# Patient Record
Sex: Female | Born: 1960 | Race: White | Hispanic: No | Smoking: Former smoker
Health system: Southern US, Community
[De-identification: ages and names within clinical notes are randomized; demographics above are authoritative.]

## PROBLEM LIST (undated history)

## (undated) DIAGNOSIS — D649 Anemia, unspecified: Secondary | ICD-10-CM

## (undated) DIAGNOSIS — C801 Malignant (primary) neoplasm, unspecified: Secondary | ICD-10-CM

## (undated) DIAGNOSIS — K219 Gastro-esophageal reflux disease without esophagitis: Secondary | ICD-10-CM

## (undated) DIAGNOSIS — S73005A Unspecified dislocation of left hip, initial encounter: Secondary | ICD-10-CM

## (undated) HISTORY — PX: SKIN CANCER EXCISION: SHX779

## (undated) HISTORY — PX: ABDOMINAL HYSTERECTOMY: SHX81

## (undated) HISTORY — PX: TUBAL LIGATION: SHX77

## (undated) HISTORY — PX: CLOSED REDUCTION HIP DISLOCATION: SUR221

## (undated) HISTORY — PX: EYE SURGERY: SHX253

## (undated) HISTORY — PX: COSMETIC SURGERY: SHX468

## (undated) HISTORY — PX: TONSILLECTOMY: SUR1361

## (undated) HISTORY — PX: OPEN ANTERIOR SHOULDER RECONSTRUCTION: SHX2100

## (undated) HISTORY — PX: BILATERAL CARPAL TUNNEL RELEASE: SHX6508

## (undated) HISTORY — PX: NASAL SEPTUM SURGERY: SHX37

---

## 1990-09-05 DIAGNOSIS — K449 Diaphragmatic hernia without obstruction or gangrene: Secondary | ICD-10-CM | POA: Insufficient documentation

## 1990-09-05 DIAGNOSIS — K589 Irritable bowel syndrome without diarrhea: Secondary | ICD-10-CM | POA: Insufficient documentation

## 1998-12-21 ENCOUNTER — Ambulatory Visit (HOSPITAL_COMMUNITY): Admission: RE | Admit: 1998-12-21 | Discharge: 1998-12-21 | Payer: Self-pay | Admitting: Gynecology

## 1998-12-21 ENCOUNTER — Encounter: Payer: Self-pay | Admitting: Gynecology

## 2000-10-25 ENCOUNTER — Encounter (INDEPENDENT_AMBULATORY_CARE_PROVIDER_SITE_OTHER): Payer: Self-pay | Admitting: *Deleted

## 2000-10-25 ENCOUNTER — Other Ambulatory Visit: Admission: RE | Admit: 2000-10-25 | Discharge: 2000-10-25 | Payer: Self-pay | Admitting: Gastroenterology

## 2000-10-25 ENCOUNTER — Encounter: Payer: Self-pay | Admitting: Gastroenterology

## 2001-08-21 ENCOUNTER — Encounter (INDEPENDENT_AMBULATORY_CARE_PROVIDER_SITE_OTHER): Payer: Self-pay

## 2001-08-21 ENCOUNTER — Ambulatory Visit (HOSPITAL_COMMUNITY): Admission: RE | Admit: 2001-08-21 | Discharge: 2001-08-21 | Payer: Self-pay | Admitting: General Surgery

## 2002-01-09 ENCOUNTER — Encounter: Payer: Self-pay | Admitting: Family Medicine

## 2002-01-09 ENCOUNTER — Ambulatory Visit (HOSPITAL_COMMUNITY): Admission: RE | Admit: 2002-01-09 | Discharge: 2002-01-09 | Payer: Self-pay | Admitting: Family Medicine

## 2002-01-16 ENCOUNTER — Encounter: Admission: RE | Admit: 2002-01-16 | Discharge: 2002-01-16 | Payer: Self-pay | Admitting: Family Medicine

## 2002-01-16 ENCOUNTER — Encounter: Payer: Self-pay | Admitting: Family Medicine

## 2003-05-15 ENCOUNTER — Encounter (INDEPENDENT_AMBULATORY_CARE_PROVIDER_SITE_OTHER): Payer: Self-pay | Admitting: Gastroenterology

## 2004-02-02 ENCOUNTER — Encounter: Payer: Self-pay | Admitting: Internal Medicine

## 2004-11-22 ENCOUNTER — Ambulatory Visit (HOSPITAL_COMMUNITY): Admission: RE | Admit: 2004-11-22 | Discharge: 2004-11-22 | Payer: Self-pay | Admitting: Family Medicine

## 2005-03-10 ENCOUNTER — Encounter: Admission: RE | Admit: 2005-03-10 | Discharge: 2005-03-10 | Payer: Self-pay | Admitting: Family Medicine

## 2007-05-23 ENCOUNTER — Encounter: Admission: RE | Admit: 2007-05-23 | Discharge: 2007-05-23 | Payer: Self-pay | Admitting: General Surgery

## 2007-06-14 ENCOUNTER — Ambulatory Visit: Payer: Self-pay | Admitting: Internal Medicine

## 2007-06-14 LAB — CONVERTED CEMR LAB: Tissue Transglutaminase Ab, IgA: 0.2 units (ref <7)

## 2007-06-18 ENCOUNTER — Ambulatory Visit (HOSPITAL_COMMUNITY): Admission: RE | Admit: 2007-06-18 | Discharge: 2007-06-18 | Payer: Self-pay | Admitting: Internal Medicine

## 2007-07-03 ENCOUNTER — Encounter: Payer: Self-pay | Admitting: Internal Medicine

## 2007-07-03 ENCOUNTER — Encounter (INDEPENDENT_AMBULATORY_CARE_PROVIDER_SITE_OTHER): Payer: Self-pay | Admitting: *Deleted

## 2007-07-03 ENCOUNTER — Ambulatory Visit: Payer: Self-pay | Admitting: Internal Medicine

## 2007-08-07 ENCOUNTER — Ambulatory Visit: Payer: Self-pay | Admitting: Internal Medicine

## 2007-08-10 ENCOUNTER — Ambulatory Visit (HOSPITAL_COMMUNITY): Admission: RE | Admit: 2007-08-10 | Discharge: 2007-08-10 | Payer: Self-pay | Admitting: Internal Medicine

## 2008-03-05 ENCOUNTER — Telehealth: Payer: Self-pay | Admitting: Internal Medicine

## 2008-03-05 DIAGNOSIS — R1011 Right upper quadrant pain: Secondary | ICD-10-CM

## 2008-03-06 ENCOUNTER — Ambulatory Visit: Payer: Self-pay | Admitting: Internal Medicine

## 2008-03-06 LAB — CONVERTED CEMR LAB
Albumin: 3.6 g/dL (ref 3.5–5.2)
BUN: 16 mg/dL (ref 6–23)
Bilirubin, Direct: 0.1 mg/dL (ref 0.0–0.3)
CO2: 28 meq/L (ref 19–32)
Eosinophils Relative: 1.9 % (ref 0.0–5.0)
Hemoglobin, Urine: NEGATIVE
Leukocytes, UA: NEGATIVE
Lipase: 28 units/L (ref 11.0–59.0)
Lymphocytes Relative: 23 % (ref 12.0–46.0)
MCHC: 33 g/dL (ref 30.0–36.0)
Monocytes Relative: 5.5 % (ref 3.0–12.0)
Neutro Abs: 4.6 10*3/uL (ref 1.4–7.7)
Neutrophils Relative %: 67.9 % (ref 43.0–77.0)
Platelets: 398 10*3/uL (ref 150–400)
Potassium: 4.3 meq/L (ref 3.5–5.1)
RDW: 14.2 % (ref 11.5–14.6)
Total Bilirubin: 0.6 mg/dL (ref 0.3–1.2)
Total Protein, Urine: NEGATIVE mg/dL
WBC: 6.8 10*3/uL (ref 4.5–10.5)

## 2008-03-17 ENCOUNTER — Ambulatory Visit: Payer: Self-pay | Admitting: Internal Medicine

## 2008-03-17 DIAGNOSIS — K219 Gastro-esophageal reflux disease without esophagitis: Secondary | ICD-10-CM

## 2008-07-25 ENCOUNTER — Telehealth: Payer: Self-pay | Admitting: Internal Medicine

## 2008-11-26 ENCOUNTER — Encounter: Admission: RE | Admit: 2008-11-26 | Discharge: 2008-11-26 | Payer: Self-pay | Admitting: Family Medicine

## 2009-01-07 ENCOUNTER — Inpatient Hospital Stay (HOSPITAL_COMMUNITY): Admission: EM | Admit: 2009-01-07 | Discharge: 2009-01-11 | Payer: Self-pay | Admitting: Emergency Medicine

## 2009-01-08 ENCOUNTER — Encounter: Payer: Self-pay | Admitting: Nurse Practitioner

## 2009-01-11 ENCOUNTER — Encounter (INDEPENDENT_AMBULATORY_CARE_PROVIDER_SITE_OTHER): Payer: Self-pay | Admitting: *Deleted

## 2009-01-13 ENCOUNTER — Telehealth: Payer: Self-pay | Admitting: Internal Medicine

## 2009-01-14 ENCOUNTER — Ambulatory Visit: Payer: Self-pay | Admitting: Gastroenterology

## 2009-01-14 ENCOUNTER — Ambulatory Visit (HOSPITAL_COMMUNITY): Admission: RE | Admit: 2009-01-14 | Discharge: 2009-01-14 | Payer: Self-pay | Admitting: Gastroenterology

## 2009-01-14 ENCOUNTER — Inpatient Hospital Stay (HOSPITAL_COMMUNITY): Admission: AD | Admit: 2009-01-14 | Discharge: 2009-01-20 | Payer: Self-pay | Admitting: Gastroenterology

## 2009-01-14 DIAGNOSIS — R112 Nausea with vomiting, unspecified: Secondary | ICD-10-CM

## 2009-01-14 DIAGNOSIS — E876 Hypokalemia: Secondary | ICD-10-CM

## 2009-01-14 DIAGNOSIS — K56609 Unspecified intestinal obstruction, unspecified as to partial versus complete obstruction: Secondary | ICD-10-CM | POA: Insufficient documentation

## 2009-01-14 DIAGNOSIS — Z8719 Personal history of other diseases of the digestive system: Secondary | ICD-10-CM

## 2009-01-14 LAB — CONVERTED CEMR LAB
Calcium: 9.6 mg/dL (ref 8.4–10.5)
Chloride: 99 meq/L (ref 96–112)
Creatinine, Ser: 0.9 mg/dL (ref 0.4–1.2)

## 2009-01-15 ENCOUNTER — Encounter: Payer: Self-pay | Admitting: Nurse Practitioner

## 2009-02-13 ENCOUNTER — Encounter: Payer: Self-pay | Admitting: Internal Medicine

## 2009-02-24 ENCOUNTER — Ambulatory Visit (HOSPITAL_COMMUNITY): Admission: RE | Admit: 2009-02-24 | Discharge: 2009-02-24 | Payer: Self-pay | Admitting: General Surgery

## 2009-02-24 ENCOUNTER — Encounter (INDEPENDENT_AMBULATORY_CARE_PROVIDER_SITE_OTHER): Payer: Self-pay | Admitting: General Surgery

## 2009-02-24 ENCOUNTER — Encounter: Payer: Self-pay | Admitting: Internal Medicine

## 2009-03-20 ENCOUNTER — Encounter: Payer: Self-pay | Admitting: Internal Medicine

## 2011-01-18 LAB — HEMOGLOBIN AND HEMATOCRIT, BLOOD
HCT: 38 % (ref 36.0–46.0)
Hemoglobin: 13 g/dL (ref 12.0–15.0)

## 2011-01-19 LAB — COMPREHENSIVE METABOLIC PANEL
ALT: 26 U/L (ref 0–35)
ALT: 55 U/L — ABNORMAL HIGH (ref 0–35)
AST: 37 U/L (ref 0–37)
AST: 57 U/L — ABNORMAL HIGH (ref 0–37)
Albumin: 3.1 g/dL — ABNORMAL LOW (ref 3.5–5.2)
Albumin: 3.5 g/dL (ref 3.5–5.2)
Albumin: 3.9 g/dL (ref 3.5–5.2)
Alkaline Phosphatase: 59 U/L (ref 39–117)
Alkaline Phosphatase: 65 U/L (ref 39–117)
BUN: 6 mg/dL (ref 6–23)
Calcium: 9.6 mg/dL (ref 8.4–10.5)
Creatinine, Ser: 0.92 mg/dL (ref 0.4–1.2)
GFR calc Af Amer: >60 mL/min (ref >60)
GFR calc Af Amer: >60 mL/min (ref >60)
GFR calc non Af Amer: >60 mL/min (ref >60)
Potassium: 3.5 mEq/L (ref 3.5–5.1)
Potassium: 4.5 mEq/L (ref 3.5–5.1)
Sodium: 137 mEq/L (ref 135–145)
Sodium: 139 mEq/L (ref 135–145)
Total Protein: 5.7 g/dL — ABNORMAL LOW (ref 6.0–8.3)
Total Protein: 6.6 g/dL (ref 6.0–8.3)

## 2011-01-19 LAB — CBC
HCT: 44.3 % (ref 36.0–46.0)
Hemoglobin: 12 g/dL (ref 12.0–15.0)
Hemoglobin: 14.3 g/dL (ref 12.0–15.0)
MCHC: 33.3 g/dL (ref 30.0–36.0)
MCHC: 33.4 g/dL (ref 30.0–36.0)
MCHC: 33.5 g/dL (ref 30.0–36.0)
MCV: 88 fL (ref 78.0–100.0)
MCV: 88.3 fL (ref 78.0–100.0)
MCV: 88.8 fL (ref 78.0–100.0)
Platelets: 326 10*3/uL (ref 150–400)
Platelets: 329 10*3/uL (ref 150–400)
Platelets: 357 10*3/uL (ref 150–400)
Platelets: 364 10*3/uL (ref 150–400)
RBC: 4.61 MIL/uL (ref 3.87–5.11)
RDW: 14.1 % (ref 11.5–15.5)
RDW: 14.6 % (ref 11.5–15.5)
RDW: 14.7 % (ref 11.5–15.5)
WBC: 6.3 10*3/uL (ref 4.0–10.5)
WBC: 6.7 10*3/uL (ref 4.0–10.5)

## 2011-01-19 LAB — DIFFERENTIAL
Basophils Absolute: 0 10*3/uL (ref 0.0–0.1)
Basophils Relative: 0 % (ref 0–1)
Eosinophils Absolute: 0 10*3/uL (ref 0.0–0.7)
Eosinophils Relative: 0 % (ref 0–5)
Eosinophils Relative: 1 % (ref 0–5)
Lymphocytes Relative: 13 % (ref 12–46)
Lymphs Abs: 1.3 10*3/uL (ref 0.7–4.0)
Monocytes Absolute: 1.1 10*3/uL — ABNORMAL HIGH (ref 0.1–1.0)
Monocytes Relative: 16 % — ABNORMAL HIGH (ref 3–12)
Neutro Abs: 7.9 10*3/uL — ABNORMAL HIGH (ref 1.7–7.7)

## 2011-01-19 LAB — BASIC METABOLIC PANEL
BUN: 19 mg/dL (ref 6–23)
BUN: 21 mg/dL (ref 6–23)
CO2: 30 mEq/L (ref 19–32)
CO2: 31 mEq/L (ref 19–32)
Chloride: 104 mEq/L (ref 96–112)
Chloride: 108 mEq/L (ref 96–112)
Chloride: 97 mEq/L (ref 96–112)
Creatinine, Ser: 0.82 mg/dL (ref 0.4–1.2)
Creatinine, Ser: 0.94 mg/dL (ref 0.4–1.2)
Creatinine, Ser: 0.99 mg/dL (ref 0.4–1.2)
GFR calc Af Amer: >60 mL/min (ref >60)
GFR calc Af Amer: >60 mL/min (ref >60)
Potassium: 3.3 mEq/L — ABNORMAL LOW (ref 3.5–5.1)

## 2011-01-19 LAB — RENAL FUNCTION PANEL
CO2: 29 mEq/L (ref 19–32)
Calcium: 8.4 mg/dL (ref 8.4–10.5)
Chloride: 107 mEq/L (ref 96–112)
GFR calc Af Amer: >60 mL/min (ref >60)
GFR calc non Af Amer: >60 mL/min (ref >60)
Glucose, Bld: 97 mg/dL (ref 70–99)
Potassium: 3.6 mEq/L (ref 3.5–5.1)
Sodium: 140 mEq/L (ref 135–145)

## 2011-01-19 LAB — MAGNESIUM
Magnesium: 2.7 mg/dL — ABNORMAL HIGH (ref 1.5–2.5)
Magnesium: 2.8 mg/dL — ABNORMAL HIGH (ref 1.5–2.5)

## 2011-01-20 LAB — CBC
HCT: 50.8 % — ABNORMAL HIGH (ref 36.0–46.0)
Hemoglobin: 17.1 g/dL — ABNORMAL HIGH (ref 12.0–15.0)
MCV: 87.4 fL (ref 78.0–100.0)
Platelets: 397 10*3/uL (ref 150–400)
RBC: 5.81 MIL/uL — ABNORMAL HIGH (ref 3.87–5.11)
WBC: 11.9 10*3/uL — ABNORMAL HIGH (ref 4.0–10.5)

## 2011-01-20 LAB — URINALYSIS, ROUTINE W REFLEX MICROSCOPIC
Hgb urine dipstick: NEGATIVE
Nitrite: NEGATIVE
Protein, ur: 30 mg/dL — AB
Specific Gravity, Urine: 1.023 (ref 1.005–1.030)
Urobilinogen, UA: 1 mg/dL (ref 0.0–1.0)

## 2011-01-20 LAB — COMPREHENSIVE METABOLIC PANEL
Albumin: 4.3 g/dL (ref 3.5–5.2)
Alkaline Phosphatase: 91 U/L (ref 39–117)
BUN: 48 mg/dL — ABNORMAL HIGH (ref 6–23)
CO2: 34 mEq/L — ABNORMAL HIGH (ref 19–32)
Calcium: 9.7 mg/dL (ref 8.4–10.5)
Chloride: 86 mEq/L — ABNORMAL LOW (ref 96–112)
GFR calc Af Amer: 31 mL/min — ABNORMAL LOW (ref >60)
GFR calc non Af Amer: 25 mL/min — ABNORMAL LOW (ref >60)
Glucose, Bld: 169 mg/dL — ABNORMAL HIGH (ref 70–99)
Total Bilirubin: 1.6 mg/dL — ABNORMAL HIGH (ref 0.3–1.2)

## 2011-01-20 LAB — DIFFERENTIAL
Basophils Absolute: 0 10*3/uL (ref 0.0–0.1)
Basophils Relative: 0 % (ref 0–1)
Lymphocytes Relative: 9 % — ABNORMAL LOW (ref 12–46)
Monocytes Absolute: 1.3 10*3/uL — ABNORMAL HIGH (ref 0.1–1.0)
Neutro Abs: 9.6 10*3/uL — ABNORMAL HIGH (ref 1.7–7.7)

## 2011-01-20 LAB — LIPASE, BLOOD: Lipase: 24 U/L (ref 11–59)

## 2011-01-20 LAB — URINE MICROSCOPIC-ADD ON

## 2011-02-22 NOTE — Op Note (Signed)
NAME:  Elizabeth Leonard, Elizabeth Leonard NO.:  [id number]   MEDICAL RECORD NO.:  [id number]          PATIENT TYPE:  INP   LOCATION:  1340                         FACILITY:  [facility]   PHYSICIAN:  Sharlet Salina T. Hoxworth, M.D.DATE OF BIRTH:  [birth year]   DATE OF PROCEDURE:  01/15/2009  DATE OF DISCHARGE:                               OPERATIVE REPORT   PREOPERATIVE DIAGNOSES:  Small bowel obstruction secondary to adhesions.   POSTOPERATIVE DIAGNOSES:  Small-bowel obstruction secondary to  adhesions.   SURGICAL PROCEDURE:  Laparoscopy and lysis of adhesions for small bowel  obstruction.   SURGEON:  Dr. Johna Sheriff   ANESTHESIA:  General.   BRIEF HISTORY:  Elizabeth Leonard is a 50 year old female whose only previous  surgical history is hysterectomy through a Pfannenstiel incision, who  presents with persistent evidence of small bowel obstruction clinically  and by plain x-ray.  I have recommended proceeding with laparoscopy and  possible laparotomy for her small-bowel obstruction.  The nature of  procedure, its indications, risks of anesthetic complications, bleeding,  infection and intestinal injury were discussed and understood.  She is  now brought to the operating room for this procedure.   DESCRIPTION OF OPERATION:  The patient was brought to the operating room  and placed in the supine position on the operating table and general  endotracheal anesthesia was induced.  PAS were in place.  She received  preoperative IV antibiotics.  The abdomen was widely sterilely prepped  and draped.  The correct patient and procedure were verified.  Access  was obtained with a 1-cm incision just below the umbilicus and  dissection carried down to the midline fascia which was incised for 1-  cm.  The peritoneum entered under direct vision.  Through a mattress  suture of 0 Vicryl, the Hassan trocar was placed and pneumoperitoneum  established.  Laparoscopy showed decompressed distal loops of small  bowel  and markedly dilated proximal loops of small bowel, but there did  not appear to be any adhesions down into her pelvis or to the anterior  abdominal wall from the previous surgery.  I placed two more 5-mm  trocars in the right lower quadrant and began to run the small bowel  retrograde.  The cecum and appendix appeared normal.  The terminal ileum  was completely decompressed.  The pelvis was free of adhesions from her  previous surgery.  The small bowel was traced proximally and at the  proximal ileum a point of obstruction was encountered where the bowel  was going down underneath an appendix epiploica from the sigmoid colon  which was tightly tethered down to the retroperitoneum and the small  bowel mesentery.  The bowel was very dilated proximal to this and seemed  to be sliding slightly underneath this bridge of tissue.  I then placed  another 5-mm trocar in the right lower quadrant and exposing this area,  I divided the appendix epiploica up near the colon and released this and  the small bowel popped up into good view.  However, because of the  inflammation of the appendix epiploica there was still  adhesions between  two loops of small bowel and the appendix epiploica was forming a band  over one of them.  I suspect this was a torsed appendix epiploica that  had become inflamed and had stuck down over the small intestine.  The  tissue which appeared to be inflamed, partially necrotic fat was then  divided over the narrowed loop of bowel which then completely released  the obstruction and clearly was the point.  I dissected that tissue off  the wall of the small bowel with careful sharp dissection completely and  the bowel was clearly widely opened and uninjured.  There no further  adhesions proximally with dilated loops of bowel up toward the ligament  of Treitz.  Following this, the abdomen was thoroughly irrigated with  saline and hemostasis assured.  Trocars removed  and all CO2  evacuated.  The mattress suture was secured to the umbilical  incision.  The skin incisions were closed with subcuticular 4-0 Vicryl  and Dermabond.  Sponge, needle and instrument counts correct.  The  patient was taken to recovery in good condition.      Lorne Skeens. Hoxworth, M.D.  Electronically Signed     BTH/MEDQ  D:  01/15/2009  T:  01/15/2009  Job:  [id number]

## 2011-02-22 NOTE — H&P (Signed)
NAME:  MARTRICE, APT NO.:  [id number]   MEDICAL RECORD NO.:  [id number]          PATIENT TYPE:  INP   LOCATION:  1340                         FACILITY:  [facility]   PHYSICIAN:  Hedwig Morton. Juanda Chance, MD     DATE OF BIRTH:  06-19-1961   DATE OF ADMISSION:  01/14/2009  DATE OF DISCHARGE:                              HISTORY & PHYSICAL   HISTORY OF PRESENT ILLNESS:  Ms. 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  Ms. Murin presented to the ER January 07, 2009 with nausea,  vomiting, dehydration, acute renal failure.  A CT scan the abdomen and  pelvis with contrast revealed a distal small-bowel obstruction without  evident etiology suggesting adhesions.  There was cholelithiasis and a  small amount of ascites among the leaves of the mesentery.  The  patient's small-bowel obstruction resolved with conservative measures  including n.p.o. status and NG tube.  She was seen by surgery during  that admission.  By Adonis Brook 4, 2010, the patient was tolerating clear  liquids and was discharged home.  Her acute renal failure had resolved.  This was the patient's first episode of a partial small-bowel  obstruction.  She does have a remote history of a hysterectomy.  Since  leaving the hospital January 11, 2009, the patient has had episodes of  nausea and vomiting almost daily.  She is usually fine in the morning  but, after eating breakfast and lunch, feels like things build up and  she finally vomits.  The patient has a history of GERD and complains of  persistent belching and acid regurgitation despite Nexium.  Emesis is  not feculent as it was in the hospital.  No fevers.  No significant  abdominal pain.   PAST MEDICAL HISTORY:  GERD, irritable bowel, hiatal hernia,  questionable Barrett's esophagus, no evidence of Barrett's on the last  few EGDs.  Cholelithiasis.   PAST SURGICAL HISTORY:  Tonsillectomy and  adenoids, hysterectomy, tubal  ligation, bilateral carpal tunnel release, repair of a deviated septum.   FAMILY HISTORY:  Family history of breast cancer.  No family history of  colon cancer.  Family medical history of diabetes and heart disease.   SOCIAL HISTORY:  The patient works in Audiological scientist.  She is a former  smoker.  Occasionally consumes alcohol.   REVIEW OF SYSTEMS:  All review of systems reviewed and negative other  than what is noted in the HPI.   CURRENT MEDICATIONS:  1. Darvocet N-100 one every 4 to 6 hours as needed.  2. Nexium 40 mg daily.  3. Multiple vitamins.   ALLERGIES:  DEMEROL.   PHYSICAL ASSESSMENT:  Weight 229, blood pressure 104/72, heart rate 72.  Ms. Flater is a pleasant 50 year old female in no acute distress.  HEENT:  No scleral icterus.  Conjunctivae pink.  NECK:  Supple.  No palpable lymphadenopathy.  CARDIAC:  Regular rate and rhythm.  No appreciable murmurs.  RESPIRATORY:  Bilateral lung fields clear.  GI:  Abdomen soft, nontender, nondistended.  No obvious masses or  hepatomegaly.  No obvious hernias.  Bowel movements are normal.  EXTREMITIES:  No palmar erythema.  No edema.  MUSCULOSKELETAL:  Symmetrical with no gross deformities.  Normal  posture.  NEUROLOGIC:  Alert and oriented.  Grossly normal neurologically.  SKIN:  Intact without significant rashes or lesions.  PSYCH:  Alert and cooperative.  Normal mood and affect.   IMPRESSION:  1. Small-bowel obstruction.  Flat and upright of the abdomen obtained      after this office visit and it does show a recurrent or early      partial small-bowel obstruction.  2. Irritable bowel syndrome.  3. Hypokalemia.  4. Cholelithiasis.   PLAN:  The patient will be admitted to [facility] for further  evaluation and treatment.  Probable surgical reconsult.  See orders for  additional plan of treatment.      Willette Cluster, NP      Hedwig Morton. Juanda Chance, MD  Electronically Signed     PG/MEDQ  D:  01/15/2009  T:  01/15/2009  Job:  [id number]

## 2011-02-22 NOTE — H&P (Signed)
NAME:  Elizabeth Leonard, Elizabeth Leonard NO.:  [id number]   MEDICAL RECORD NO.:  [id number]          PATIENT TYPE:  INP   LOCATION:  1305                         [facility]ACILITY[facility]   PHYSICIAN:  Altha Harm, MDDATE OF BIRTH:  1961/06/19   DATE OF ADMISSION:  01/07/2009  DATE OF DISCHARGE:                              HISTORY & PHYSICAL   CHIEF COMPLAINT:  Passed out.   HISTORY OF PRESENT ILLNESS:  This is a 50 year old female who reported  that she has been having nausea and vomiting for the past 3 days.  She  states that she has been unable to keep anything down and her emesis has  been productive of bilious nature.  But there had been no blood noted.  She states that early this morning she started having dizziness upon  standing up, went to the refrigerator and passed out.  She denies any  palpitations, any scotoma, any chest pain, any cough.  She states that  prior to passing out, she did have dizziness which she has been having  for about a day and she was also having nausea.  She was seen in the ER  and found to have a blood pressure of 97/56 upon arrival.  The patient  does not remember how long she was out for.  She states that her  daughter was present and called EMS.  The patient has no prior cardiac  history and she denies any preceding focal deficits.  She states that  she has had no measurable fever but has been having chills.  She states  that her urine output has been decreased over the past 3 days as her  oral intake has been markedly decreased.  The patient also describes  abdominal pain which she describes as periumbilical, dull and rated as 7  to 10 out of 10.  She states the pain is consistent for the past 3 days.  It is relieved with emesis and the pain is nonradiating in its nature.  She has had a sick contact.  Her daughter has had a viral illness which  has caused her to have nausea, vomiting and diarrhea.  The patient  however denies any diarrhea.   PAST MEDICAL HISTORY:  1. Significant for gastroesophageal reflux disease.  2. Irritable bowel syndrome.  3. Allergic rhinitis.   FAMILY HISTORY:  The patient's family history was explored and denies  any chronic illnesses in any first degree relatives in her family.   SOCIAL HISTORY:  The patient works as an Airline pilot.  She denies any  tobacco, alcohol or drug use.  She lives at home with her husband and  her daughter.   CURRENT MEDICATIONS:  Include Nexium and doxycycline.  She does not know  the doses.   ALLERGIES:  No known drug allergies.   GASTROENTEROLOGY:  She sees Dr. Marina Goodell.   REVIEW OF SYSTEMS:  14 systems were reviewed, all systems are negative  except as noted in the HPI.   STUDIES IN THE EMERGENCY ROOM:  She had  laboratory studies which showed  a hemogram with  white blood  cell count of 11.9, hemoglobin of 17.1,  hematocrit of 50.8,  platelet count of 397.  Sodium 128, potassium 3.2,  chloride 80, bicarb 30, BUN 48, creatinine 2.08.  No radiologic studies  were done in the emergency room.   PHYSICAL EXAMINATION:  This is a well-nourished, well-developed female  appearing in mild distress secondary to abdominal pain.  VITAL SIGNS:  Temperature 98.6, heart rate 78, blood pressure 118/60,  respirations 18, saturations are 98% on room air.  HEENT:  She is normocephalic, atraumatic.  Pupils are equally round and  reactive to light and accommodation.  She is anicteric with no  conjunctival pallor.  Oropharynx is moist.  No exudate, erythema or  lesions are noted.  NECK:  Trachea is midline.  No masses, no thyromegaly, no JVD, no  carotid bruit.  RESPIRATORY:  She has a normal respiratory effort, equal excursion  bilaterally.  No wheezing or rhonchi noted.  CARDIOVASCULAR:  She has a  normal S1-S2.  No murmurs, rubs or gallops are noted.  PMI is  nondisplaced.  No heaves or thrills on palpation.  ABDOMEN:  Soft, obese, diffuse tenderness, more marked tenderness in  the  left lower quadrant than anywhere else in the abdomen.  No masses, no  hepatosplenomegaly noted.  LYMPHATICS:  On survey she has no cervical, axillary, inguinal  lymphadenopathy noted.  NEUROLOGIC:  No focal neurological deficits.  Cranial nerves II through  XII grossly intact.  CARDIOVASCULAR:  She has a normal S1-S2, no murmurs, rubs or gallops  noted.  PMI is nondisplaced.  No heaves or thrills on palpation.  MUSCULOSKELETAL:  No CVA tenderness.  No warmth, swelling or erythema  around the joints.  PSYCHIATRIC:  She is alert and oriented x3.  Good insight and cognition.  Good recent and remote recall.   ASSESSMENT AND PLAN:  This is a patient who presents with:  1. Intractable nausea and vomiting and abdominal pain.  Etiology is      unknown at this time.  I will go ahead and get an acute abdominal      series on this patient given that she is having bilious emesis.      This could likely just be gastroenteritis but need to rule out the      advent of an ileus or any other intra-abdominal processes.  I will      hold off in doing a CT scan at this point on the patient until we      get the results of the acute abdominal series.  The patient's left      lower quadrant pain is somewhat bothersome but the patient does      have a history of irritable bowel syndrome and abdominal pain as      she reports it.  We will hydrate the patient and observe her      response to initial therapy.  2.  Acute renal failure.  This is      likely secondary to volume depletion.  The patient is having her      volume repleted  aggressively and she will have a repeat follow-up      on her renal function.  I expect that it will resolve as she      increases her volume intake.  2. Hypokalemia.  Potassium is being replaced.  3. The patient also received GI and DVT prophylaxis with Protonix and      unfractionated heparin.  The patient will receive symptomatic  support for her nausea, vomiting and  will start her on clear      liquids.   Total time for this admission:  One hour      Altha Harm, MD  Electronically Signed     MAM/MEDQ  D:  01/07/2009  T:  01/07/2009  Job:  [id number]
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
## 2011-02-22 NOTE — Op Note (Signed)
NAME:  Elizabeth Leonard, Elizabeth Leonard NO.:  [id number]   MEDICAL RECORD NO.:  [id number]          PATIENT TYPE:  AMB   LOCATION:  DAY                          [facility]AC[facility]   PHYSICIAN:  Sharlet Salina T. Hoxworth, M.D.DATE OF BIRTH:  08/14/1961   DATE OF PROCEDURE:  02/24/2009  DATE OF DISCHARGE:                               OPERATIVE REPORT   PREOPERATIVE DIAGNOSIS:  Cholelithiasis and cholecystitis.   POSTOPERATIVE DIAGNOSIS:  Cholelithiasis and cholecystitis.   SURGICAL PROCEDURE:  Laparoscopic cholecystectomy with intraoperative  cholangiogram.   SURGEON:  Sharlet Salina T. Hoxworth, MD.   ANESTHESIA:  General.   ASSISTANT:  Anselm Pancoast. Zachery Dakins, MD.   BRIEF HISTORY:  Elizabeth Leonard is a 50 year old female, who presents with  episodic epigastric and right upper quadrant abdominal pain and nausea  typical for biliary colic.  She is about 3 weeks following a  laparoscopic lysis of adhesions for a small bowel obstruction.  She was  known to have gallstones at that time but was asymptomatic.  She began  developing symptoms over the last couple of weeks.  She is doing well  otherwise at this point, and I have recommended proceeding with  laparoscopic cholecystectomy with cholangiogram.  The nature of the  procedures, indications, risks of bleeding, infection, bile leak, bile  duct injury, and anesthetic complications were discussed and understood.  She is now brought to the operating room for this procedure.   DESCRIPTION OF OPERATION:  The patient was brought to the operating  room, placed in the supine position on the operating table, and a  general endotracheal anesthesia was induced.  The abdomen was widely  sterilely prepped and draped.  She received preoperative IV antibiotics.  PAS were in place.  Correct patient and procedure were verified.  Access  was obtained with a 5 mm Optiview trocar in the right upper quadrant  without difficulty and pneumoperitoneum established.   There were no  adhesions to the previous 1 cm infraumbilical laparoscopic open port and  this was therefore sharply incised into the peritoneum, and through a  mattress suture of 0 Vicryl the Hassan trocar was placed.  Another 11-mm  trocar was placed subxiphoid and another 5-mm trocar more laterally in  the right flank.  The gallbladder was visualized and was not acutely  inflamed.  The fundus was grasped and elevated up over the liver and the  infundibulum retracted inferolaterally.  The peritoneum anterior and  posterior to Calot's triangle was incised and the distal gallbladder  thoroughly dissected.  The cystic duct was identified, dissected free,  and the cystic duct/gallbladder junction dissected 360 degrees.  When  the anatomy appeared clear, an operative cholangiogram was obtained  through the cystic duct, which showed good filling of normal common bile  duct and intrahepatic ducts with free flow into the duodenum and no  filling defects.  Following this, the cholangiocath was removed and the  cystic duct was triply clipped proximally and divided.  The cystic  artery was doubly clipped proximally, clipped distally, and divided.  The gallbladder was then dissected free from its bed using hook  cautery  and removed through the umbilicus.  The subhepatic space was carefully  irrigated and inspected and there was no evidence of bleeding.  Following this, trocars removed, all CO2 evacuated, and the mattress  suture was secured at  the umbilical incision.  This was then also reinforced with 2 simple  fascial 0 Vicryl sutures.  Skin was closed with subcuticular Monocryl  and Dermabond.  Sponge, needle, and instrument counts were correct.  The  patient was taken to recovery in good condition.      Lorne Skeens. Hoxworth, M.D.  Electronically Signed     BTH/MEDQ  D:  02/24/2009  T:  02/24/2009  Job:  [id number]

## 2011-02-22 NOTE — Assessment & Plan Note (Signed)
[facility]                         GASTROENTEROLOGY OFFICE NOTE   ANELY, SPIEWAK                       MRN:          [id number]  DATE:08/07/2007                            DOB:          1961/02/02    HISTORY:  Elizabeth Leonard presents today for followup.  She is a 50 year old  who was evaluated June 14, 2007 regarding multiple GI complaints.  See that dictation for details.  For reflux disease with intermittent  dysphagia she was placed on Nexium 40 mg daily.  For presumed irritable  bowel, she was treated with a combination of a probiotic and Bentyl.  A  tissue transglutaminase antibody was obtained and returned normal.  She  underwent colonoscopy and upper endoscopy.  Colonoscopy, including  intubation of the terminal ileum was normal.  Random biopsies of the  colon were normal with no evidence of microscopic colitis.  Upper  endoscopy revealed a small sliding hiatal hernia.  There was prior  question of Barrett's esophagus on remote endoscopy with subsequent  negative exams.  The Z line itself was somewhat irregular, though.  Obvious Barrett's was not clearly present.  Multiple biopsies were taken  with the aid of narrow-band imaging.  These returned normal with no  evidence of Barrett's.  She presents today for followup.  First, she  reports significant improvement of reflux symptoms.  She does have  occasional breakthrough.  Her dysphagia has resolved on medication  alone.  In terms of loose stools related to irritable bowel she feels  that the probiotic was quite helpful, and diarrhea is currently not a  problem.  She does have intermittent ongoing abdominal discomfort which  is cramping, though improved with Bentyl.  She does describe 1 episode  of severe sharp pain in the region of the left shoulder blade that  radiated into the left chest and subsequently into the abdomen.  She  feels certain it is gallbladder attack, and requests a  hepatobiliary  scan.  Prior abdominal ultrasound was negative with no evidence of  gallstones.   CURRENT MEDICATIONS:  1. Nexium 40 mg daily.  2. Bentyl 20 mg p.r.n.   PHYSICAL EXAMINATION:  A well-appearing female in no acute distress.  Blood pressure is 124/72, heart rate is 84, weight is 268 pounds.  HEENT:  Sclerae anicteric.  Her abdomen is soft without tenderness, masses, or hernia.  Good bowel  sounds heard.   IMPRESSION:  1. Gastroesophageal reflux disease.  Symptoms much better controlled      with Nexium.  2. Questionable history of Barrett's.  No evidence of such on her past      3 endoscopies.  I do not recommend further surveillance.  3. Irritable bowel syndrome with completely normal colonoscopy as      described.  4. Left scapular pain of uncertain etiology. Question musculoskeletal.      Rule out atypical biliary colic (patient concerned).   RECOMMENDATIONS:  1. Continue Nexium.  2. Continue reflux precautions with attention to weight loss.  3. Use the probiotic p.r.n. irritable bowel exacerbation.  4. Continue Bentyl p.r.n. for irritable bowel.  5. Schedule HIDA scan.  6. Secure primary care Siyah Mault as soon as possible for routine      medical care.Advised.  7. Routine Gastrointestinal followup in 1 year unless interval      questions or problems.     Wilhemina Bonito. Marina Goodell, MD  Electronically Signed    JNP/MedQ  DD: 08/07/2007  DT: 08/07/2007  Job #: [id number]   cc:   Sheppard Plumber. Earlene Plater, M.D.

## 2011-02-22 NOTE — Discharge Summary (Signed)
NAME:  Elizabeth Leonard, CHANCY NO.:  [id number]   MEDICAL RECORD NO.:  [id number]          PATIENT TYPE:  INP   LOCATION:  1305                         FACILITY:  [facility]   PHYSICIAN:  Renee Ramus, MD       DATE OF BIRTH:  1961/05/31   DATE OF ADMISSION:  01/07/2009  DATE OF DISCHARGE:  01/11/2009                               DISCHARGE SUMMARY   PRIMARY DISCHARGE DIAGNOSIS:  Small bowel obstruction.   SECONDARY DIAGNOSES:  1. Transient hypokalemia.  2. Acute renal failure, secondary to prerenal dehydration.  3. Transient hyponatremia.  4. Gastroesophageal reflux disease.  5. Irritable bowel syndrome.   HOSPITAL COURSE BY PROBLEM:  1. Small bowel obstruction.  The patient is a 50 year old female who      was admitted secondary to severe nausea, vomiting, and crampy      abdominal pain.  The patient had the symptoms approximately 4 days      prior to admission.  She had severe nausea and vomiting.  She came      in dehydrated, acute renal failure.  She was given IV fluids.  She      had NG tube placed.  She had a surgical consult.  The patient      benefited from watchful waiting.  Her symptoms are now resolved.      She has good bowel sounds and has tolerated clear-liquid diet very      well.  The patient will be discharged home with instructions to      follow up with her primary care physician if needed.  She has been      instructed to continue clear liquids x3 days, and to fully advance      her diet after that.  The patient has also been instructed not to      take dicyclomine until cleared by her primary care physician.  2. Gastroesophageal reflux disease.  The patient will be continued on      proton pump inhibitor.  3. Transient hypokalemia and hyponatremia.  This is now resolved with      electrolyte replacement.   LABORATORY DATA:  Labs of note,  1. Initial leukocytosis, white count 11.9, decreasing to 6.2 without      antibiotic therapy.  2. Initial  elevated hemoglobin and hematocrit of 17.1 and 50.8      respectively.  This is decreased to hemoglobin of 12 and hematocrit      of 36 after IV fluid.  3. Initial BUN of 48, creatinine of 2.08, this has decreased to BUN of      12 and creatinine 0.8 with IV fluid.  4. Magnesium somewhat elevated initially at 2.7.  5. Albumin is mildly low at 3.1.  6. UA showing specific gravity of 1.023 with no evidence of infection.   STUDIES:  1. Abdominal CT scan showing distal small bowel obstruction.  2. Followup abdominal films showing clearing of distal small bowel      obstruction with last film showing a normal distribution of gas and      stool and  a nondilated colon.   MEDICATIONS ON DISCHARGE:  Nexium 40 mg p.o. daily.   The patient was asked not to take dicyclomine.  There are no labs or  studies pending at the time of discharge.  The patient is in stable  condition and anxious for discharge.   TIME SPENT:  35 minutes.      Renee Ramus, MD  Electronically Signed     JF/MEDQ  D:  01/11/2009  T:  01/12/2009  Job:  [id number]   cc:   Lorne Skeens. Hoxworth, M.D.  [address]  [place]  [place] [address]

## 2011-02-22 NOTE — Consult Note (Signed)
NAME:  ALLIEN, MELBERG NO.:  [id number]   MEDICAL RECORD NO.:  [id number]          PATIENT TYPE:  INP   LOCATION:  1305                         FACILITY:  [facility]   PHYSICIAN:  Sharlet Salina T. Hoxworth, M.D.DATE OF BIRTH:  March 24, 1961   DATE OF CONSULTATION:  01/08/2009  DATE OF DISCHARGE:                                 CONSULTATION   CHIEF COMPLAINT:  Abdominal pain, nausea, vomiting.   HISTORY OF PRESENT ILLNESS:  I was asked by the [facility] team to  evaluate Ms. Kalb.  She is a pleasant 50 year old female who states she  was in her usual state of good health until 5 days ago, when she awoke  early in the morning with severe crampy left lower quadrant and left mid  abdominal pain.  This pain was intermittent, crampy in nature, and quite  severe at times.  Several hours after this, she began to develop nausea  and vomiting with initially light green bilious vomiting and then dark  green.  The pain was temporarily relieved by vomiting but then would  recur in a few hours.  She continued to put up with these symptoms at  home, thinking she might just have a virus, but became progressively  weaker, and then yesterday while getting up she had a syncopal episode  and presented to the [facility] emergency room for evaluation.  She has  continued to have the intermittent left-sided crampy abdominal pain and  vomiting even since admission.  She has not had a bowel movement or  flatus since the onset of her symptoms.  She states she had a normal  bowel movement the day before this illness.  She had not had any  abdominal complaints or GI symptoms leading up to this illness.  She has  felt a little bit chilled and had a temperature of 99.  She has had no  urinary burning, frequency.  No hematemesis or rectal bleeding.  She has  not noted any lumps or bulges on her abdominal wall.   PAST MEDICAL HISTORY:  Previous surgery significant for a hysterectomy  through a  Pfannenstiel incision.  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  Medically she is followed for GERD and irritable bowel syndrome  at [facility].   Medications on admission were Nexium and doxycycline for skin  irritation.   ALLERGIES:  NO KNOWN DRUG ALLERGIES.   SOCIAL HISTORY:  She works at a [employer].  Married.  Rare alcohol.  No  tobacco.   FAMILY HISTORY:  Noncontributory.   REVIEW OF SYSTEMS:  GENERAL:  Positive for some malaise and possible low-  grade fever.  HEENT:  Denies vision, hearing, swallowing problems.  RESPIRATORY:  No shortness of breath, cough, wheezing, history of lung  disease.  CARDIAC:  No chest pain, palpitations, history of heart  disease.  ABDOMEN/GASTROINTESTINAL:  As above.  She has intermittent IBS  but not active recently.  Reflux controlled with medications.  GENITOURINARY:  As above.  MUSCULOSKELETAL:  No joint swelling,  pain,  arthritis.  HEMATOLOGIC:  No history of blood clots, abnormal bleeding.   PHYSICAL EXAMINATION:  VITAL SIGNS:  She is afebrile.  Heart rate is 74,  respirations 20, blood pressure 133/78, O2 sats 96% on 2 liters.  GENERAL:  Pleasant, mildly overweight white female in no acute distress.  SKIN:  Warm and dry.  No rash or infection.  HEENT:  No palpable masses or thyromegaly.  Sclerae nonicteric.  Oropharynx clear.  LYMPH NODES:  No cervical, subclavicular or inguinal nodes palpable.  LUNGS:  Clear without wheezing or increased work of breathing.  CARDIAC:  Regular rate and rhythm.  No murmurs.  No edema.  ABDOMEN:  Well-healed Pfannenstiel incision.  No hernias.  Mild  distention.  Bowel sounds are present.  There is mild left lower  quadrant and left mid abdominal tenderness without guarding or rebound.  No palpable mass.  EXTREMITIES:  No joint swelling or deformity.  NEUROLOGIC:  Alert, oriented.  Motor and sensory grossly normal.   LABORATORY:   White count today is 6.7, down from 11.9 on admission.  Hemoglobin is 14.3.  Platelets normal.  Chemistries on admission showed  a BUN of 48, now down to 28.  Electrolytes normal.  She had hyponatremia  and hypokalemia on presentation.  Creatinine is down from 2.8 to 1.0  today.  Glucose 116.  LFTs normal.  Urinalysis negative except for mild  proteinuria, perhaps consistent with partial bowel obstruction although  cannot rule out severe constipation.   ASSESSMENT/PLAN:  A 50 year old white female with history of  hysterectomy and presentation abdominal x-rays consistent with bowel  obstruction, likely secondary to adhesions.  Cannot completely rule out  severe constipation on plain films.  She is scheduled for a CT scan,  which should help sort this out.  I agree with bowel rest with NG IV  fluids, and we will follow closely with you.      Lorne Skeens. Hoxworth, M.D.  Electronically Signed     BTH/MEDQ  D:  01/08/2009  T:  01/08/2009  Job:  [id number]

## 2011-02-25 NOTE — Discharge Summary (Signed)
NAME:  Elizabeth Leonard, Elizabeth Leonard NO.:  [id number]   MEDICAL RECORD NO.:  [id number]          PATIENT TYPE:  INP   LOCATION:  1340                         FACILITY[facility]  [facility]   PHYSICIAN:  Sharlet Salina T. Hoxworth, M.D.DATE OF BIRTH:  23-Jul-1961   DATE OF ADMISSION:  01/14/2009  DATE OF DISCHARGE:  01/20/2009                               DISCHARGE SUMMARY   DISCHARGE DIAGNOSIS:  Small-bowel obstruction.   OPERATIONS AND PROCEDURES:  Laparoscopy with relief of small-bowel  obstruction January 15, 2009.   HISTORY OF PRESENT ILLNESS:  Ms. Bookwalter is a 50 year old female who was  admitted January 07, 2009 with x-ray evidence of distal small bowel  obstruction which was managed nonoperatively.  She has no history of any  previous surgery.  She did well at discharge but now presents with 24  hours of recurrent nausea, vomiting, bilious emesis, had no bowel  movement x3 days.   PAST MEDICAL HISTORY:  Surgery significant only for hysterectomy done  through a Pfannenstiel incision.  Also, mild reflux.   MEDICATIONS:  Darvocet p.r.n., and Nexium, multivitamins.   ALLERGIES:  DEMEROL.   HOSPITAL COURSE:  The patient was admitted with flat and upright  abdominal x-ray showing small bowel distention.  This was small-bowel  obstruction.  She was initially treated with NG suction, IV fluids, pain  and nausea medication.  She symptomatically improved.  However,  following day on April 8 flat and upright abdomen showed no improvement  of small bowel obstruction pattern.  At this point I recommended  laparoscopic exploration with possibility of laparotomy.  She was taken  to the operating room and findings at laparoscopy were a __________  inflamed appendix __________ to the sigmoid colon which had become  adherent to the small bowel causing a high-grade obstruction.  She  underwent laparoscopic lysis of these inflammatory adhesions.  She  postoperatively did well.  NG tube was discontinued on the  first  postoperative day.  She rapidly regained bowel function.  Diet was  advanced and she is discharged home on January 20, 2009.  The wounds are  healing nicely.  Tolerating regular diet.   DISCHARGE MEDICATIONS:  Same as admission.   FOLLOWUP:  In my office in 1 week.      Lorne Skeens. Hoxworth, M.D.  Electronically Signed     BTH/MEDQ  D:  03/03/2009  T:  03/03/2009  Job:  [id number]

## 2011-02-25 NOTE — Op Note (Signed)
[facility]  Patient:    Elizabeth Leonard, Elizabeth Leonard Visit Number: [id number] MRN: [id number]          Service Type: DSU Location: DAY Attending Physician:  Carson Myrtle Dictated by:   Sheppard Plumber Earlene Plater, M.D. Proc. Date: 08/21/01 Admit Date:  08/21/2001   CC:         Hope M. Danella Deis, M.D.   Operative Report  PREOPERATIVE DIAGNOSIS:  Squamous cell carcinoma of lower leg.  POSTOPERATIVE DIAGNOSIS:  Squamous cell carcinoma of lower leg.  PROCEDURE:  Wide excision of squamous cell carcinoma of lower extremity.  SURGEON:  Timothy E. Earlene Plater, M.D.  ANESTHESIA:  Local standby.  INDICATIONS:  The patient is 30 and otherwise healthy.  Had several lesions removed by Dr. Campbell Stall and a lesion of the left lower leg was returned as squamous cell carcinoma, well-differentiated, margins positive.  She is here now for a wide excision.  The lesion is located in the middle of the left lower leg, slightly lateral to the anterior midline.  It measures 11 mm.  Her permission is granted.  DESCRIPTION OF PROCEDURE:  The patient was taken to the operating room and placed supine, and IV ______ sedation given.  The left lower extremity was prepped and draped in the usual fashion.  Marcaine 0.25% with epinephrine was used for local anesthesia.  Under magnification, the lesion was carefully measured, and then I was able to excise 3+ mm on either side, both cephalad and caudad, and then completed the ellipse so that closure could be accomplished lateral-to-lateral.  The ellipse was completed and the lesions completely excised with the underlying subcutaneous tissue.  Flaps were raised by dissecting the subcutaneous tissue from the fascia, and then retention sutures of 0 nylon were placed.  Subsequent retention sutures of 2-0 nylon were placed and then the skin incision was actually closed with a running 3-0 nylon.  She tolerated it well.  Specimen was placed in formalin and  sent to the lab. Written and verbal instructions given.  She will be seen and followed in the office. Dictated by:   Sheppard Plumber Earlene Plater, M.D. Attending Physician:  Carson Myrtle DD:  08/21/01 TD:  08/21/01 Job: [id number]

## 2017-07-17 ENCOUNTER — Encounter: Payer: Self-pay | Admitting: Internal Medicine

## 2020-01-16 ENCOUNTER — Ambulatory Visit: Payer: Self-pay | Attending: Internal Medicine

## 2020-01-16 DIAGNOSIS — Z23 Encounter for immunization: Secondary | ICD-10-CM

## 2020-02-10 ENCOUNTER — Ambulatory Visit: Payer: Self-pay | Attending: Internal Medicine

## 2020-02-10 DIAGNOSIS — Z23 Encounter for immunization: Secondary | ICD-10-CM

## 2020-02-10 NOTE — Progress Notes (Signed)
   Covid-19 Vaccination Clinic  Name:  Elizabeth Leonard    MRN: [id number] DOB: [birth year]  02/10/2020  Elizabeth Leonard was observed post Covid-19 immunization for 15 minutes without incident. She was provided with Vaccine Information Sheet and instruction to access the V-Safe system.   Elizabeth Leonard was instructed to call 911 with any severe reactions post vaccine: Marland Kitchen Difficulty breathing  . Swelling of face and throat  . A fast heartbeat  . A bad rash all over body  . Dizziness and weakness   Immunizations Administered    Name Date Dose VIS Date Route   Pfizer COVID-19 Vaccine 02/10/2020 10:55 AM 0.3 mL 12/04/2018 Intramuscular   Manufacturer: [place]   Lot: [id number]   [place]: [id number]      Covid-19 Vaccination Clinic  Name:  Elizabeth Leonard    MRN: [id number] DOB: 1961/08/16  02/10/2020  Elizabeth Leonard was observed post Covid-19 immunization for 15 minutes without incident. She was provided with Vaccine Information Sheet and instruction to access the V-Safe system.   Elizabeth Leonard was instructed to call 911 with any severe reactions post vaccine: Marland Kitchen Difficulty breathing  . Swelling of face and throat  . A fast heartbeat  . A bad rash all over body  . Dizziness and weakness   Immunizations Administered    Name Date Dose VIS Date Route   Pfizer COVID-19 Vaccine 02/10/2020 10:55 AM 0.3 mL 12/04/2018 Intramuscular   Manufacturer: [place]   Lot: [id number]   [place]: [id number]

## 2021-01-12 ENCOUNTER — Encounter: Payer: Self-pay | Admitting: Orthopedic Surgery

## 2021-01-12 ENCOUNTER — Ambulatory Visit (INDEPENDENT_AMBULATORY_CARE_PROVIDER_SITE_OTHER): Payer: Self-pay

## 2021-01-12 ENCOUNTER — Ambulatory Visit: Payer: Self-pay | Admitting: Orthopedic Surgery

## 2021-01-12 DIAGNOSIS — M25461 Effusion, right knee: Secondary | ICD-10-CM

## 2021-01-12 DIAGNOSIS — M25561 Pain in right knee: Secondary | ICD-10-CM

## 2021-01-12 MED ORDER — METHYLPREDNISOLONE ACETATE 40 MG/ML IJ SUSP
40.0000 mg | INTRAMUSCULAR | Status: AC | PRN
  Administered 2021-01-12: 40 mg via INTRA_ARTICULAR

## 2021-01-12 MED ORDER — LIDOCAINE HCL (PF) 1 % IJ SOLN
5.0000 mL | INTRAMUSCULAR | Status: AC | PRN
  Administered 2021-01-12: 5 mL

## 2021-01-12 NOTE — Progress Notes (Signed)
Office Visit Note   Patient: Elizabeth Leonard           Date of Birth: Oct 25, 1960           MRN: [id number] Visit Date: 01/12/2021              Requested by: No referring provider defined for this encounter. PCP: No primary care provider on file.  Chief Complaint  Patient presents with  . Right Knee - Pain    S/p fall down steps Saturday      HPI: Patient is a 60 year old woman who states that she twisted her knee on the stairs Saturday morning she heard a pop and states she almost passed out from the pain.  She states she did not fall.  She is currently wearing a neoprene knee sleeve.  She complains of pain and swelling she has been using ice and elevation.  Assessment & Plan: Visit Diagnoses:  1. Acute pain of right knee   2. Effusion, right knee     Plan: Will obtain a CT scan to further evaluate the right knee.  She will minimize her weightbearing use her walker and follow-up this week after the CT scan is obtained.  Follow-Up Instructions: Return in about 1 week (around 01/19/2021).   Ortho Exam  Patient is alert, oriented, no adenopathy, well-dressed, normal affect, normal respiratory effort. Examination patient has no ecchymosis or bruising around the right knee there is an effusion.  Varus and valgus stresses stable anterior drawer is stable.  Medial lateral joint lines are nontender to palpation.  She has active extension with no extensor lag.  The knee was aspirated for the superior lateral portal and 30 cc of a hemarthrosis was aspirated.  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  There is a suggestion of a possible nondisplaced lateral tibial plateau fracture.  No images are attached to the encounter.  Labs: No results found for: HGBA1C, ESRSEDRATE, CRP, LABURIC, REPTSTATUS, GRAMSTAIN, CULT, LABORGA   Lab Results  Component Value Date   ALBUMIN  2.8 (L) 01/16/2009   ALBUMIN 3.9 01/14/2009   ALBUMIN 3.1 (L) 01/11/2009    Lab Results  Component Value Date   MG 2.7 (H) 01/10/2009   MG 2.8 (H) 01/09/2009   MG 2.7 (H) 01/08/2009   No results found for: VD25OH  No results found for: PREALBUMIN CBC EXTENDED Latest Ref Rng & Units 02/24/2009 01/14/2009 01/11/2009  WBC 4.0 - 10.5 K/uL - 10.1 6.2  RBC 3.87 - 5.11 MIL/uL - 5.02 4.07  HGB 12.0 - 15.0 g/dL 13.0 14.8 12.0  HCT 36.0 - 46.0 % 38.0 44.3 36.0  PLT 150 - 400 K/uL - 357 326  NEUTROABS 1.7 - 7.7 K/uL - 7.9(H) -  LYMPHSABS 0.7 - 4.0 K/uL - 1.3 -     There is no height or weight on file to calculate BMI.  Orders:  Orders Placed This Encounter  Procedures  . XR Knee 1-2 Views Right   No orders of the defined types were placed in this encounter.    Procedures: Large Joint Inj: R knee on 01/12/2021 4:00 PM Indications: pain and diagnostic evaluation Details: 22 G 1.5 in needle, superolateral approach  Arthrogram: No  Medications: 5 mL lidocaine (PF) 1 %; 40 mg methylPREDNISolone acetate 40 MG/ML Aspirate: 30 mL bloody Outcome: tolerated well, no immediate complications Procedure, treatment alternatives, risks and benefits explained,  specific risks discussed. Consent was given by the patient. Immediately prior to procedure a time out was called to verify the correct patient, procedure, equipment, support staff and site/side marked as required. Patient was prepped and draped in the usual sterile fashion.      Clinical Data: No additional findings.  ROS:  All other systems negative, except as noted in the HPI. Review of Systems  Objective: Vital Signs: There were no vitals taken for this visit.  Specialty Comments:  No specialty comments available.  PMFS History: Patient Active Problem List   Diagnosis Date Noted  . HYPOKALEMIA 01/14/2009  . SMALL BOWEL OBSTRUCTION 01/14/2009  . NAUSEA AND VOMITING 01/14/2009  . SMALL BOWEL OBSTRUCTION, HX OF 01/14/2009  .  GERD 03/17/2008  . ABDOMINAL PAIN RIGHT UPPER QUADRANT 03/05/2008  . OBESITY, MORBID 12/06/2007  . HIATAL HERNIA 09/05/1990  . IRRITABLE BOWEL SYNDROME 09/05/1990   History reviewed. No pertinent past medical history.  History reviewed. No pertinent family history.  History reviewed. No pertinent surgical history. Social History   Occupational History  . Not on file  Tobacco Use  . Smoking status: Not on file  . Smokeless tobacco: Not on file  Substance and Sexual Activity  . Alcohol use: Not on file  . Drug use: Not on file  . Sexual activity: Not on file

## 2021-01-14 ENCOUNTER — Telehealth: Payer: Self-pay | Admitting: Orthopedic Surgery

## 2021-01-14 NOTE — Telephone Encounter (Signed)
Per [facility]: 01/14/21 pt applying for cone assistant,and will call back to schedule/epic order/ab

## 2021-01-14 NOTE — Telephone Encounter (Signed)
Pt called asking when she was going to have her Ct Scan done? [phone]

## 2021-01-14 NOTE — Telephone Encounter (Signed)
Pt calling to check status of CT scan for eval of tibial plateau fx. Can you please call?

## 2021-01-14 NOTE — Telephone Encounter (Signed)
FYI

## 2021-01-18 ENCOUNTER — Ambulatory Visit (HOSPITAL_BASED_OUTPATIENT_CLINIC_OR_DEPARTMENT_OTHER)
Admission: RE | Admit: 2021-01-18 | Discharge: 2021-01-18 | Disposition: A | Discharge status: Home / Self Care | Payer: Self-pay | Source: Ambulatory Visit | Attending: Orthopedic Surgery | Admitting: Orthopedic Surgery

## 2021-01-18 ENCOUNTER — Other Ambulatory Visit: Payer: Self-pay

## 2021-01-18 DIAGNOSIS — M25561 Pain in right knee: Secondary | ICD-10-CM | POA: Insufficient documentation

## 2021-01-18 DIAGNOSIS — M25461 Effusion, right knee: Secondary | ICD-10-CM | POA: Insufficient documentation

## 2021-01-19 ENCOUNTER — Other Ambulatory Visit: Payer: Self-pay | Admitting: Physician Assistant

## 2021-01-19 ENCOUNTER — Encounter (HOSPITAL_COMMUNITY): Payer: Self-pay | Admitting: Orthopedic Surgery

## 2021-01-19 ENCOUNTER — Other Ambulatory Visit (HOSPITAL_COMMUNITY)
Admission: RE | Admit: 2021-01-19 | Discharge: 2021-01-19 | Disposition: A | Discharge status: Home / Self Care | Payer: Self-pay | Source: Ambulatory Visit | Attending: Orthopedic Surgery | Admitting: Orthopedic Surgery

## 2021-01-19 ENCOUNTER — Other Ambulatory Visit: Payer: Self-pay

## 2021-01-19 DIAGNOSIS — Z01812 Encounter for preprocedural laboratory examination: Secondary | ICD-10-CM | POA: Insufficient documentation

## 2021-01-19 DIAGNOSIS — Z20822 Contact with and (suspected) exposure to covid-19: Secondary | ICD-10-CM | POA: Insufficient documentation

## 2021-01-19 LAB — SARS CORONAVIRUS 2 (TAT 6-24 HRS): SARS Coronavirus 2: NEGATIVE

## 2021-01-19 NOTE — Progress Notes (Signed)
PCP - Denies  Cardiologist -  Denies  Chest x-ray -  Denies  EKG -  Denies  Stress Test -  Denies  ECHO -  Denies  Cardiac Cath -  Denies  AICD-na PM-na LOOP-na  Sleep Study -  Denies CPAP -  Denies  LABS- 01/19/21: COVID 01/20/21: CBC  ASA-  Denies  ERAS- Yes- until 1230pm  HA1C-  Denies  Anesthesia- No  Pt denies having chest pain, sob, or fever during the pre-op phone call. All instructions explained to the pt, with a verbal understanding including as of today, stop taking all Aspirin (unless instructed by your doctor) and Other Aspirin containing products, Vitamins, Fish oils, and Herbal medications. Also stop all NSAIDS i.e. Advil, Ibuprofen, Motrin, Aleve, Anaprox, Naproxen, BC, Goody Powders, and all Supplements. Pt also instructed to self quarantine after being tested for COVID-19. The opportunity to ask questions was provided.   Coronavirus Screening  Have you experienced the following symptoms:  Cough yes/no: No Fever (>100.77F)  yes/no: No Runny nose yes/no: No Sore throat yes/no: No Difficulty breathing/shortness of breath  yes/no: No  Have you or a family member traveled in the last 14 days and where? yes/no: No   If the patient indicates "YES" to the above questions, their PAT will be rescheduled to limit the exposure to others and, the surgeon will be notified. THE PATIENT WILL NEED TO BE ASYMPTOMATIC FOR 14 DAYS.   If the patient is not experiencing any of these symptoms, the PAT nurse will instruct them to NOT bring anyone with them to their appointment since they may have these symptoms or traveled as well.   Please remind your patients and families that hospital visitation restrictions are in effect and the importance of the restrictions.

## 2021-01-20 ENCOUNTER — Ambulatory Visit (HOSPITAL_COMMUNITY): Payer: Self-pay

## 2021-01-20 ENCOUNTER — Observation Stay (HOSPITAL_COMMUNITY)
Admission: RE | Admit: 2021-01-20 | Discharge: 2021-01-21 | Disposition: A | Discharge status: Home / Self Care | Payer: Self-pay | Attending: Orthopedic Surgery | Admitting: Orthopedic Surgery

## 2021-01-20 ENCOUNTER — Ambulatory Visit (HOSPITAL_BASED_OUTPATIENT_CLINIC_OR_DEPARTMENT_OTHER): Payer: Self-pay

## 2021-01-20 ENCOUNTER — Encounter (HOSPITAL_COMMUNITY)
Admission: RE | Disposition: A | Discharge status: Home / Self Care | Payer: Self-pay | Source: Home / Self Care | Attending: Orthopedic Surgery

## 2021-01-20 ENCOUNTER — Other Ambulatory Visit: Payer: Self-pay

## 2021-01-20 ENCOUNTER — Encounter (HOSPITAL_COMMUNITY): Payer: Self-pay | Admitting: Orthopedic Surgery

## 2021-01-20 DIAGNOSIS — S82143A Displaced bicondylar fracture of unspecified tibia, initial encounter for closed fracture: Secondary | ICD-10-CM | POA: Diagnosis present

## 2021-01-20 DIAGNOSIS — Z79899 Other long term (current) drug therapy: Secondary | ICD-10-CM | POA: Insufficient documentation

## 2021-01-20 DIAGNOSIS — K219 Gastro-esophageal reflux disease without esophagitis: Secondary | ICD-10-CM | POA: Insufficient documentation

## 2021-01-20 DIAGNOSIS — T148XXA Other injury of unspecified body region, initial encounter: Secondary | ICD-10-CM

## 2021-01-20 DIAGNOSIS — S82121A Displaced fracture of lateral condyle of right tibia, initial encounter for closed fracture: Secondary | ICD-10-CM

## 2021-01-20 DIAGNOSIS — S82124A Nondisplaced fracture of lateral condyle of right tibia, initial encounter for closed fracture: Principal | ICD-10-CM | POA: Insufficient documentation

## 2021-01-20 DIAGNOSIS — Z87891 Personal history of nicotine dependence: Secondary | ICD-10-CM | POA: Insufficient documentation

## 2021-01-20 DIAGNOSIS — W108XXA Fall (on) (from) other stairs and steps, initial encounter: Secondary | ICD-10-CM | POA: Insufficient documentation

## 2021-01-20 HISTORY — PX: ORIF TIBIA PLATEAU: SHX2132

## 2021-01-20 HISTORY — DX: Gastro-esophageal reflux disease without esophagitis: K21.9

## 2021-01-20 HISTORY — DX: Anemia, unspecified: D64.9

## 2021-01-20 HISTORY — DX: Unspecified dislocation of left hip, initial encounter: S73.005A

## 2021-01-20 HISTORY — DX: Malignant (primary) neoplasm, unspecified: C80.1

## 2021-01-20 LAB — CBC
HCT: 46.3 % — ABNORMAL HIGH (ref 36.0–46.0)
Hemoglobin: 14.8 g/dL (ref 12.0–15.0)
MCH: 28.2 pg (ref 26.0–34.0)
MCHC: 32 g/dL (ref 30.0–36.0)
MCV: 88.4 fL (ref 80.0–100.0)
Platelets: 351 10*3/uL (ref 150–400)
RBC: 5.24 MIL/uL — ABNORMAL HIGH (ref 3.87–5.11)
RDW: 14.1 % (ref 11.5–15.5)
WBC: 7.3 10*3/uL (ref 4.0–10.5)
nRBC: 0 % (ref 0.0–0.2)

## 2021-01-20 SURGERY — OPEN REDUCTION INTERNAL FIXATION (ORIF) TIBIAL PLATEAU
Anesthesia: General | Laterality: Right

## 2021-01-20 MED ORDER — ACETAMINOPHEN 160 MG/5ML PO SOLN: 1000.0000 mg | Freq: Once | ORAL | Status: DC | PRN

## 2021-01-20 MED ORDER — ONDANSETRON HCL 4 MG/2ML IJ SOLN
INTRAMUSCULAR | Status: AC
  Filled 2021-01-20: qty 2

## 2021-01-20 MED ORDER — ACETAMINOPHEN 10 MG/ML IV SOLN
INTRAVENOUS | Status: DC | PRN
  Administered 2021-01-20: 1000 mg via INTRAVENOUS

## 2021-01-20 MED ORDER — ASPIRIN EC 325 MG PO TBEC
325.0000 mg | DELAYED_RELEASE_TABLET | Freq: Every day | ORAL | Status: DC
  Administered 2021-01-20 – 2021-01-21 (×2): 325 mg via ORAL
  Filled 2021-01-20 (×3): qty 1

## 2021-01-20 MED ORDER — DOCUSATE SODIUM 100 MG PO CAPS
100.0000 mg | ORAL_CAPSULE | Freq: Two times a day (BID) | ORAL | Status: DC
  Administered 2021-01-20 – 2021-01-21 (×2): 100 mg via ORAL
  Filled 2021-01-20 (×2): qty 1

## 2021-01-20 MED ORDER — OXYCODONE HCL 5 MG PO TABS: 5.0000 mg | ORAL_TABLET | ORAL | Status: DC | PRN

## 2021-01-20 MED ORDER — HYDROMORPHONE HCL 1 MG/ML IJ SOLN
0.2500 mg | INTRAMUSCULAR | Status: DC | PRN
  Administered 2021-01-20: 0.5 mg via INTRAVENOUS

## 2021-01-20 MED ORDER — BUPIVACAINE HCL (PF) 0.25 % IJ SOLN
INTRAMUSCULAR | Status: DC | PRN
  Administered 2021-01-20: 10 mL

## 2021-01-20 MED ORDER — ACETAMINOPHEN 10 MG/ML IV SOLN: 1000.0000 mg | Freq: Once | INTRAVENOUS | Status: DC | PRN

## 2021-01-20 MED ORDER — PROPOFOL 10 MG/ML IV BOLUS
INTRAVENOUS | Status: AC
  Filled 2021-01-20: qty 20

## 2021-01-20 MED ORDER — CHLORHEXIDINE GLUCONATE 0.12 % MT SOLN
15.0000 mL | Freq: Once | OROMUCOSAL | Status: AC
  Administered 2021-01-20: 15 mL via OROMUCOSAL
  Filled 2021-01-20: qty 15

## 2021-01-20 MED ORDER — MIDAZOLAM HCL 5 MG/5ML IJ SOLN
INTRAMUSCULAR | Status: DC | PRN
  Administered 2021-01-20: 2 mg via INTRAVENOUS

## 2021-01-20 MED ORDER — KETOROLAC TROMETHAMINE 15 MG/ML IJ SOLN
15.0000 mg | Freq: Four times a day (QID) | INTRAMUSCULAR | Status: DC
  Administered 2021-01-20 – 2021-01-21 (×3): 15 mg via INTRAVENOUS
  Filled 2021-01-20 (×3): qty 1

## 2021-01-20 MED ORDER — CLONIDINE HCL (ANALGESIA) 100 MCG/ML EP SOLN
EPIDURAL | Status: DC | PRN
  Administered 2021-01-20: 30 ug
  Administered 2021-01-20: 50 ug

## 2021-01-20 MED ORDER — OXYCODONE HCL 5 MG PO TABS
5.0000 mg | ORAL_TABLET | Freq: Once | ORAL | Status: DC | PRN
Start: 2021-01-20 — End: 2021-01-20

## 2021-01-20 MED ORDER — FENTANYL CITRATE (PF) 250 MCG/5ML IJ SOLN
INTRAMUSCULAR | Status: AC
  Filled 2021-01-20: qty 5

## 2021-01-20 MED ORDER — METOCLOPRAMIDE HCL 5 MG/ML IJ SOLN
5.0000 mg | Freq: Three times a day (TID) | INTRAMUSCULAR | Status: DC | PRN
Start: 2021-01-20 — End: 2021-01-21

## 2021-01-20 MED ORDER — ACETAMINOPHEN 325 MG PO TABS: 325.0000 mg | ORAL_TABLET | Freq: Four times a day (QID) | ORAL | Status: DC | PRN

## 2021-01-20 MED ORDER — KETOROLAC TROMETHAMINE 30 MG/ML IJ SOLN
INTRAMUSCULAR | Status: DC | PRN
  Administered 2021-01-20: 30 mg via INTRAVENOUS

## 2021-01-20 MED ORDER — ONDANSETRON HCL 4 MG PO TABS: 4.0000 mg | ORAL_TABLET | Freq: Four times a day (QID) | ORAL | Status: DC | PRN

## 2021-01-20 MED ORDER — ACETAMINOPHEN 500 MG PO TABS: 1000.0000 mg | ORAL_TABLET | Freq: Once | ORAL | Status: DC | PRN

## 2021-01-20 MED ORDER — DEXAMETHASONE SODIUM PHOSPHATE 10 MG/ML IJ SOLN
INTRAMUSCULAR | Status: AC
  Filled 2021-01-20: qty 1

## 2021-01-20 MED ORDER — ORAL CARE MOUTH RINSE: 15.0000 mL | Freq: Once | OROMUCOSAL | Status: AC

## 2021-01-20 MED ORDER — BUPIVACAINE HCL (PF) 0.25 % IJ SOLN
INTRAMUSCULAR | Status: AC
  Filled 2021-01-20: qty 30

## 2021-01-20 MED ORDER — ONDANSETRON HCL 4 MG/2ML IJ SOLN
INTRAMUSCULAR | Status: DC | PRN
  Administered 2021-01-20: 4 mg via INTRAVENOUS

## 2021-01-20 MED ORDER — ACETAMINOPHEN 10 MG/ML IV SOLN
INTRAVENOUS | Status: AC
  Filled 2021-01-20: qty 100

## 2021-01-20 MED ORDER — DEXMEDETOMIDINE (PRECEDEX) IN NS 20 MCG/5ML (4 MCG/ML) IV SYRINGE
PREFILLED_SYRINGE | INTRAVENOUS | Status: DC | PRN
Start: 2021-01-20 — End: 2021-01-20
  Administered 2021-01-20: 20 ug via INTRAVENOUS

## 2021-01-20 MED ORDER — METOCLOPRAMIDE HCL 5 MG PO TABS: 5.0000 mg | ORAL_TABLET | Freq: Three times a day (TID) | ORAL | Status: DC | PRN

## 2021-01-20 MED ORDER — HYDROMORPHONE HCL 1 MG/ML IJ SOLN
INTRAMUSCULAR | Status: AC
  Administered 2021-01-20: 0.5 mg via INTRAVENOUS
  Filled 2021-01-20: qty 1

## 2021-01-20 MED ORDER — FENTANYL CITRATE (PF) 100 MCG/2ML IJ SOLN
INTRAMUSCULAR | Status: AC
  Filled 2021-01-20: qty 2

## 2021-01-20 MED ORDER — CEFAZOLIN SODIUM-DEXTROSE 2-4 GM/100ML-% IV SOLN
2.0000 g | Freq: Four times a day (QID) | INTRAVENOUS | Status: AC
  Administered 2021-01-20 – 2021-01-21 (×3): 2 g via INTRAVENOUS
  Filled 2021-01-20 (×3): qty 100

## 2021-01-20 MED ORDER — HYDROMORPHONE HCL 1 MG/ML IJ SOLN: 0.5000 mg | INTRAMUSCULAR | Status: DC | PRN

## 2021-01-20 MED ORDER — HYDROMORPHONE HCL 1 MG/ML IJ SOLN
INTRAMUSCULAR | Status: AC
  Filled 2021-01-20: qty 0.5

## 2021-01-20 MED ORDER — MIDAZOLAM HCL 2 MG/2ML IJ SOLN
INTRAMUSCULAR | Status: AC
  Filled 2021-01-20: qty 2

## 2021-01-20 MED ORDER — LACTATED RINGERS IV SOLN: INTRAVENOUS | Status: DC

## 2021-01-20 MED ORDER — BUPIVACAINE-EPINEPHRINE (PF) 0.5% -1:200000 IJ SOLN
INTRAMUSCULAR | Status: DC | PRN
  Administered 2021-01-20: 15 mL
  Administered 2021-01-20: 25 mL

## 2021-01-20 MED ORDER — PROPOFOL 10 MG/ML IV BOLUS
INTRAVENOUS | Status: DC | PRN
  Administered 2021-01-20: 40 mg via INTRAVENOUS
  Administered 2021-01-20: 160 mg via INTRAVENOUS

## 2021-01-20 MED ORDER — HYDROMORPHONE HCL 1 MG/ML IJ SOLN
INTRAMUSCULAR | Status: DC | PRN
  Administered 2021-01-20: .5 mg via INTRAVENOUS

## 2021-01-20 MED ORDER — DEXAMETHASONE SODIUM PHOSPHATE 4 MG/ML IJ SOLN
INTRAMUSCULAR | Status: DC | PRN
  Administered 2021-01-20: 2 mg via INTRAVENOUS
  Administered 2021-01-20: 3 mg via INTRAVENOUS

## 2021-01-20 MED ORDER — SODIUM CHLORIDE 0.9 % IV SOLN: INTRAVENOUS | Status: DC

## 2021-01-20 MED ORDER — DEXAMETHASONE SODIUM PHOSPHATE 10 MG/ML IJ SOLN
INTRAMUSCULAR | Status: DC | PRN
  Administered 2021-01-20: 8 mg via INTRAVENOUS

## 2021-01-20 MED ORDER — CEFAZOLIN SODIUM-DEXTROSE 2-4 GM/100ML-% IV SOLN
2.0000 g | INTRAVENOUS | Status: AC
  Administered 2021-01-20: 2 g via INTRAVENOUS
  Filled 2021-01-20: qty 100

## 2021-01-20 MED ORDER — FENTANYL CITRATE (PF) 100 MCG/2ML IJ SOLN: 25.0000 ug | INTRAMUSCULAR | Status: DC | PRN

## 2021-01-20 MED ORDER — OXYCODONE HCL 5 MG/5ML PO SOLN
5.0000 mg | Freq: Once | ORAL | Status: DC | PRN
Start: 2021-01-20 — End: 2021-01-20

## 2021-01-20 MED ORDER — ONDANSETRON HCL 4 MG/2ML IJ SOLN: 4.0000 mg | Freq: Four times a day (QID) | INTRAMUSCULAR | Status: DC | PRN

## 2021-01-20 MED ORDER — 0.9 % SODIUM CHLORIDE (POUR BTL) OPTIME
TOPICAL | Status: DC | PRN
  Administered 2021-01-20: 1000 mL

## 2021-01-20 MED ORDER — FENTANYL CITRATE (PF) 100 MCG/2ML IJ SOLN
INTRAMUSCULAR | Status: DC | PRN
  Administered 2021-01-20 (×6): 25 ug via INTRAVENOUS
  Administered 2021-01-20: 75 ug via INTRAVENOUS
  Administered 2021-01-20: 25 ug via INTRAVENOUS

## 2021-01-20 SURGICAL SUPPLY — 69 items
BIT DRILL 100X2.5XANTM LCK (BIT) ×1 IMPLANT
BIT DRILL 4X70 QC CALB QC (BIT) ×2 IMPLANT
BIT DRILL CAL (BIT) ×1 IMPLANT
BIT DRL 100X2.5XANTM LCK (BIT) ×1
BLADE CLIPPER SURG (BLADE) IMPLANT
BLADE SURG 10 STRL SS (BLADE) ×2 IMPLANT
BNDG COHESIVE 6X5 TAN STRL LF (GAUZE/BANDAGES/DRESSINGS) ×2 IMPLANT
BNDG GAUZE ELAST 4 BULKY (GAUZE/BANDAGES/DRESSINGS) ×2 IMPLANT
BONE CANC CHIPS 20CC PCAN1/4 (Bone Implant) ×2 IMPLANT
CHIPS CANC BONE 20CC PCAN1/4 (Bone Implant) ×1 IMPLANT
CLEANER TIP ELECTROSURG 2X2 (MISCELLANEOUS) ×2 IMPLANT
COVER MAYO STAND STRL (DRAPES) ×2 IMPLANT
COVER SURGICAL LIGHT HANDLE (MISCELLANEOUS) ×2 IMPLANT
COVER WAND RF STERILE (DRAPES) ×2 IMPLANT
CUFF TOURN SGL QUICK 34 (TOURNIQUET CUFF)
CUFF TOURN SGL QUICK 42 (TOURNIQUET CUFF) IMPLANT
CUFF TRNQT CYL 34X4.125X (TOURNIQUET CUFF) IMPLANT
DRAPE C-ARM 42X72 X-RAY (DRAPES) IMPLANT
DRAPE INCISE IOBAN 66X45 STRL (DRAPES) ×2 IMPLANT
DRAPE U-SHAPE 47X51 STRL (DRAPES) ×2 IMPLANT
DRILL BIT 2.5MM (BIT) ×2
DRILL BIT CAL (BIT) ×2
DRSG ADAPTIC 3X8 NADH LF (GAUZE/BANDAGES/DRESSINGS) ×2 IMPLANT
DRSG PAD ABDOMINAL 8X10 ST (GAUZE/BANDAGES/DRESSINGS) ×2 IMPLANT
DURAPREP 26ML APPLICATOR (WOUND CARE) ×2 IMPLANT
ELECT REM PT RETURN 9FT ADLT (ELECTROSURGICAL) ×2
ELECTRODE REM PT RTRN 9FT ADLT (ELECTROSURGICAL) ×1 IMPLANT
EVACUATOR 1/8 PVC DRAIN (DRAIN) IMPLANT
GAUZE SPONGE 4X4 12PLY STRL (GAUZE/BANDAGES/DRESSINGS) ×2 IMPLANT
GLOVE BIOGEL PI IND STRL 9 (GLOVE) ×1 IMPLANT
GLOVE BIOGEL PI INDICATOR 9 (GLOVE) ×1
GLOVE SURG ORTHO 9.0 STRL STRW (GLOVE) ×2 IMPLANT
GOWN STRL REUS W/ TWL XL LVL3 (GOWN DISPOSABLE) ×3 IMPLANT
GOWN STRL REUS W/TWL XL LVL3 (GOWN DISPOSABLE) ×6
K-WIRE ACE 1.6X6 (WIRE) ×4
KIT BASIN OR (CUSTOM PROCEDURE TRAY) ×2 IMPLANT
KIT TURNOVER KIT B (KITS) ×2 IMPLANT
KWIRE ACE 1.6X6 (WIRE) ×2 IMPLANT
MANIFOLD NEPTUNE II (INSTRUMENTS) ×2 IMPLANT
NEEDLE 22X1 1/2 (OR ONLY) (NEEDLE) ×2 IMPLANT
NS IRRIG 1000ML POUR BTL (IV SOLUTION) ×2 IMPLANT
PACK ORTHO EXTREMITY (CUSTOM PROCEDURE TRAY) ×2 IMPLANT
PAD ARMBOARD 7.5X6 YLW CONV (MISCELLANEOUS) ×4 IMPLANT
PLATE LOCK 5H STD RT PROX TIB (Plate) ×2 IMPLANT
SCREW CORT FT 32X3.5XNONLOCK (Screw) ×1 IMPLANT
SCREW CORTICAL 3.5MM  32MM (Screw) ×2 IMPLANT
SCREW CORTICAL 3.5MM  34MM (Screw) ×2 IMPLANT
SCREW CORTICAL 3.5MM 26MM (Screw) ×2 IMPLANT
SCREW CORTICAL 3.5MM 34MM (Screw) ×1 IMPLANT
SCREW CORTICAL 3.5MM 40MM (Screw) ×2 IMPLANT
SCREW LOCK CORT STAR 3.5X70 (Screw) ×6 IMPLANT
SCREW LP 3.5X70MM (Screw) ×2 IMPLANT
SPONGE LAP 18X18 RF (DISPOSABLE) ×2 IMPLANT
STAPLER VISISTAT 35W (STAPLE) IMPLANT
STOCKINETTE IMPERVIOUS LG (DRAPES) ×2 IMPLANT
SUCTION FRAZIER HANDLE 10FR (MISCELLANEOUS) ×2
SUCTION TUBE FRAZIER 10FR DISP (MISCELLANEOUS) ×1 IMPLANT
SUT ETHILON 2 0 PSLX (SUTURE) IMPLANT
SUT ETHILON 4 0 PS 2 18 (SUTURE) IMPLANT
SUT VIC AB 0 CT1 27 (SUTURE) ×2
SUT VIC AB 0 CT1 27XBRD ANBCTR (SUTURE) ×1 IMPLANT
SUT VIC AB 1 CTB1 27 (SUTURE) ×2 IMPLANT
SUT VIC AB 2-0 CTB1 (SUTURE) ×2 IMPLANT
SYR 20ML ECCENTRIC (SYRINGE) ×2 IMPLANT
TOWEL GREEN STERILE (TOWEL DISPOSABLE) ×2 IMPLANT
TOWEL GREEN STERILE FF (TOWEL DISPOSABLE) ×2 IMPLANT
TUBE CONNECTING 12X1/4 (SUCTIONS) ×2 IMPLANT
WATER STERILE IRR 1000ML POUR (IV SOLUTION) ×4 IMPLANT
YANKAUER SUCT BULB TIP NO VENT (SUCTIONS) ×2 IMPLANT

## 2021-01-20 NOTE — Anesthesia Preprocedure Evaluation (Addendum)
Anesthesia Evaluation  Patient identified by MRN, date of birth, ID band Patient awake    Reviewed: Allergy & Precautions, NPO status , Patient's Chart, lab work & pertinent test results  History of Anesthesia Complications Negative for: history of anesthetic complications  Airway Mallampati: II  TM Distance: >3 FB Neck ROM: Full    Dental  (+) Teeth Intact, Dental Advisory Given,    Pulmonary neg shortness of breath, neg sleep apnea, neg COPD, neg recent URI, former smoker,  Covid-19 Nucleic Acid Test Results Lab Results      Component                Value               Date                      [place]              NEGATIVE            01/19/2021              breath sounds clear to auscultation       Cardiovascular negative cardio ROS   Rhythm:Regular     Neuro/Psych  Neuromuscular disease negative psych ROS   GI/Hepatic Neg liver ROS, GERD  Medicated,  Endo/Other  negative endocrine ROS  Renal/GU negative Renal ROS     Musculoskeletal  Right Tibial Plateau Fracture   Abdominal   Peds  Hematology negative hematology ROS (+) Lab Results      Component                Value               Date                      WBC                      7.3                 01/20/2021                HGB                      14.8                01/20/2021                HCT                      46.3 (H)            01/20/2021                MCV                      88.4                01/20/2021                PLT                      351                 01/20/2021              Anesthesia Other Findings   Reproductive/Obstetrics  Anesthesia Physical Anesthesia Plan  ASA: II  Anesthesia Plan: General   Post-op Pain Management:    Induction: Intravenous  PONV Risk Score and Plan: 3 and Ondansetron and Dexamethasone  Airway Management Planned: Oral ETT  Additional  Equipment: None  Intra-op Plan:   Post-operative Plan: Extubation in OR  Informed Consent: I have reviewed the patients History and Physical, chart, labs and discussed the procedure including the risks, benefits and alternatives for the proposed anesthesia with the patient or authorized representative who has indicated his/her understanding and acceptance.     Dental advisory given  Plan Discussed with: CRNA and Surgeon  Anesthesia Plan Comments:         Anesthesia Quick Evaluation

## 2021-01-20 NOTE — Anesthesia Procedure Notes (Signed)
Anesthesia Regional Block: Popliteal block   Pre-Anesthetic Checklist: ,, timeout performed, Correct Patient, Correct Site, Correct Laterality, Correct Procedure, Correct Position, site marked, Risks and benefits discussed,  Surgical consent,  Pre-op evaluation,  At surgeon's request and post-op pain management  Laterality: Right  Prep: chloraprep       Needles:  Injection technique: Single-shot  Needle Type: Stimiplex     Needle Length: 10cm  Needle Gauge: 21     Additional Needles:   Procedures:,,,, ultrasound used (permanent image in chart),,,,  Motor weakness within 5 minutes.  Narrative:  Start time: 01/20/2021 6:25 PM End time: 01/20/2021 6:35 PM Injection made incrementally with aspirations every 5 mL.  Performed by: Personally  Anesthesiologist: Nolon Nations, MD  Additional Notes: Nerve located and needle positioned with direct ultrasound guidance. Good perineural spread. Patient tolerated well.

## 2021-01-20 NOTE — Anesthesia Procedure Notes (Signed)
Anesthesia Regional Block: Adductor canal block   Pre-Anesthetic Checklist: ,, timeout performed, Correct Patient, Correct Site, Correct Laterality, Correct Procedure, Correct Position, site marked, Risks and benefits discussed,  Surgical consent,  Pre-op evaluation,  At surgeon's request and post-op pain management  Laterality: Right  Prep: chloraprep       Needles:  Injection technique: Single-shot  Needle Type: Stimulator Needle - 80     Needle Length: 9cm  Needle Gauge: 22   Needle insertion depth: 6 cm   Additional Needles:   Procedures:,,,, ultrasound used (permanent image in chart),,,,  Narrative:  Start time: 01/20/2021 6:15 PM End time: 01/20/2021 6:25 PM Injection made incrementally with aspirations every 5 mL.  Performed by: Personally  Anesthesiologist: Nolon Nations, MD  Additional Notes: BP cuff, EKG monitors applied. Sedation begun. Femoral artery palpated for location of nerve. After nerve location verified with U/S, anesthetic injected incrementally, slowly, and after negative aspirations under direct u/s guidance. Good perineural spread. Patient tolerated well.

## 2021-01-20 NOTE — Anesthesia Procedure Notes (Signed)
Procedure Name: LMA Insertion Date/Time: 01/20/2021 4:31 PM Performed by: Amadeo Garnet, CRNA Pre-anesthesia Checklist: Patient identified, Emergency Drugs available, Suction available and Patient being monitored Patient Re-evaluated:Patient Re-evaluated prior to induction Oxygen Delivery Method: Circle system utilized Preoxygenation: Pre-oxygenation with 100% oxygen Induction Type: IV induction LMA: LMA inserted LMA Size: 3.0 Placement Confirmation: positive ETCO2 Dental Injury: Teeth and Oropharynx as per pre-operative assessment

## 2021-01-20 NOTE — Interval H&P Note (Signed)
History and Physical Interval Note:  01/20/2021 2:11 PM  Elizabeth Leonard  has presented today for surgery, with the diagnosis of Right Tibial Plateau Fracture.  The various methods of treatment have been discussed with the patient and family. After consideration of risks, benefits and other options for treatment, the patient has consented to  Procedure(s): OPEN REDUCTION INTERNAL FIXATION (ORIF) RIGHT TIBIAL PLATEAU FRACTURE (Right) as a surgical intervention.  The patient's history has been reviewed, patient examined, no change in status, stable for surgery.  I have reviewed the patient's chart and labs.  Questions were answered to the patient's satisfaction.     [name]
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
## 2021-01-20 NOTE — Transfer of Care (Signed)
Immediate Anesthesia Transfer of Care Note  Patient: Elizabeth Leonard  Procedure(s) Performed: OPEN REDUCTION INTERNAL FIXATION (ORIF) RIGHT TIBIAL PLATEAU FRACTURE (Right )  Patient Location: PACU  Anesthesia Type:General  Level of Consciousness: awake and alert   Airway & Oxygen Therapy: Patient Spontanous Breathing and Patient connected to face mask oxygen  Post-op Assessment: Report given to RN and Post -op Vital signs reviewed and stable  Post vital signs: Reviewed and stable  Last Vitals:  Vitals Value Taken Time  BP 135/55 01/20/21 1748  Temp    Pulse 69 01/20/21 1749  Resp 16 01/20/21 1749  SpO2 100 % 01/20/21 1749  Vitals shown include unvalidated device data.  Last Pain:  Vitals:   01/20/21 1330  TempSrc: Oral  PainSc:       Patients Stated Pain Goal: 3 (29/56/21 3086)  Complications: No complications documented.

## 2021-01-20 NOTE — Op Note (Signed)
01/20/2021  5:44 PM  PATIENT:  Elizabeth Leonard    PRE-OPERATIVE DIAGNOSIS:  Right Tibial Plateau Fracture  POST-OPERATIVE DIAGNOSIS:  Same  PROCEDURE:  OPEN REDUCTION INTERNAL FIXATION (ORIF) RIGHT TIBIAL PLATEAU FRACTURE  SURGEON:  [name], MD  PHYSICIAN ASSISTANT:None ANESTHESIA:   General  PREOPERATIVE INDICATIONS:  Elizabeth Leonard is a  60 y.o. female with a diagnosis of Right Tibial Plateau Fracture who failed conservative measures and elected for surgical management.    The risks benefits and alternatives were discussed with the patient preoperatively including but not limited to the risks of infection, bleeding, nerve injury, cardiopulmonary complications, the need for revision surgery, among others, and the patient was willing to proceed.  OPERATIVE IMPLANTS: Biomet tibial plateau plate lateral 20 cc demineralized bone matrix @ENCIMAGES @  OPERATIVE FINDINGS: C-arm fluoroscopy verified reduction of the impacted tibial plateau fracture with application of 20 cc demineralized bone matrix  OPERATIVE PROCEDURE: Patient was brought the operating room and underwent a general anesthetic.  After adequate levels anesthesia were obtained patient's right lower extremity was prepped using DuraPrep draped into a sterile field a timeout was called.  A lateral hockey-stick incision was made this was carried down to the lateral tibial plateau.  A hole was made laterally for the impactor.  The impactor was used to elevate the impacted tibial plateau.  C-arm fluoroscopy verified restoration of the height of the tibial plateau.  This was then packed with demineralized bone matrix.  C-arm fluoroscopy verified alignment.  The lateral plate was applied guidewire was inserted to identify alignment.  A compression screw was placed approximately 70 mm in length 2 compression screws were placed distally.  C-arm fluoroscopy verified alignment.  A total of 3 screws were placed proximally and 2 of  the screws were locked.  The wound was irrigated normal saline C arthroscopy verified alignment.  The fascial layer was closed using 0 Vicryl the subcu was closed using 0 Vicryl the skin was closed using staples a sterile dressing was applied patient was extubated taken the PACU in stable condition   DISCHARGE PLANNING:  Antibiotic duration: 24 hours  Weightbearing: Nonweightbearing on the right  Pain medication: Opioid pathway  Dressing care/ Wound VAC: Dry dressing change in 1 week  Ambulatory devices: Walker with Bledsoe brace  Discharge to: Discharge to home when safe with therapy  Follow-up: In the office 1 week post operative.

## 2021-01-21 ENCOUNTER — Encounter (HOSPITAL_COMMUNITY): Payer: Self-pay | Admitting: Orthopedic Surgery

## 2021-01-21 ENCOUNTER — Telehealth: Payer: Self-pay | Admitting: Orthopedic Surgery

## 2021-01-21 MED ORDER — ASPIRIN 325 MG PO TBEC: 325.0000 mg | DELAYED_RELEASE_TABLET | Freq: Every day | ORAL | 0 refills | Status: AC

## 2021-01-21 MED ORDER — OXYCODONE HCL 5 MG PO TABS: 5.0000 mg | ORAL_TABLET | ORAL | 0 refills | Status: AC | PRN

## 2021-01-21 NOTE — Telephone Encounter (Signed)
Santiago Glad with [facility] called stating they received a referral for a home health nurse; and [facility] is wanting to make sure Dr. Sharol Given will sign off for orders on this pt.   Karen# [phone]

## 2021-01-21 NOTE — Progress Notes (Signed)
Orthopedic Tech Progress Note Patient Details:  Eiliyah Reh 09-12-1961 [id number] Called in order to [facility] for Bledsoe brace Patient ID: Daneesha Quinteros, female   DOB: May 16, 1961, 60 y.o.   MRN: [id number]   Chip Boer 01/21/2021, 7:12 AM

## 2021-01-21 NOTE — Progress Notes (Signed)
Patient discharged with transportation provided by daughter to home.  Patient received AVS along with education which included new medications and follow up appointments with patient verbalizing understanding of information.  Patient's PIV was removed prior to discharge with patient transported via wheelchair by primary RN and NT off the unit at 1800.  Patient was discharged with 3-in-1 BSC as ordered and left with all belongings intact.

## 2021-01-21 NOTE — Progress Notes (Signed)
Is postop day 1 status post open reduction internal fixation of a tibial plateau fracture.  She is lying in bed does not have a brace.   Vital signs stable afebrile dressing clean dry in place compartments are soft and compressible.  Patient is arranging for help at home and if possible and can do well with physical therapy would like to discharge home later today.  We will see how physical therapy goes.  Will do discharge paperwork.  She does need a Bledsoe brace prior to discharge

## 2021-01-21 NOTE — Progress Notes (Signed)
PT Cancellation Note  Patient Details Name: Naryah Clenney MRN: [id number] DOB: 1961-05-10   Cancelled Treatment:    Reason Eval/Treat Not Completed: Medical issues which prohibited therapy.  Waiting for brace to mobilize pt, and will retry later.   Ramond Dial 01/21/2021, 8:28 AM Mee Hives, PT MS Acute Rehab Dept. Number: [place] [facility] [place]

## 2021-01-21 NOTE — Progress Notes (Signed)
Pt was seen for mobility on RW today, to walk a short trip then to get on stairs with cues and improved demonstration of maintaining NWB on RLE.  Pt is expecting to go home with sisters and daughters to assist her, and will add home health PT to the care.  Also have given written instructions for stair gait with one crutch and one rail.  Follow for acute PT goals as ordered below.  01/21/21 1200  PT Visit Information  Last PT Received On 01/21/21  Assistance Needed +1  PT/OT/SLP Co-Evaluation/Treatment Yes  PT goals addressed during session Proper use of DME;Balance;Mobility/safety with mobility  History of Present Illness Patient is a 60 year old woman who states that she twisted her knee on the stairs Saturday morning she heard a pop and states she almost passed out from the pain.  She states she did not fall. OPEN REDUCTION INTERNAL FIXATION (ORIF) RIGHT TIBIAL PLATEAU FRACTURE (Right) as a surgical intervention.  Precautions  Precautions Fall  Required Braces or Orthoses Knee Immobilizer - Right  Restrictions  Weight Bearing Restrictions Yes  RLE Weight Bearing NWB  Home Living  Family/patient expects to be discharged to: Private residence  Living Arrangements Alone  Available Help at Discharge Family  Type of [place] to enter  Entrance Stairs-Number of Steps 3  Entrance Stairs-Rails Right  [place] One level  Bathroom Shower/Tub Walk-in Cytogeneticist No  [place] - 2 wheels;Crutches;Shower seat;Wheelchair - manual  Prior Function  Level of Independence Independent  Communication  Communication No difficulties  Pain Assessment  Pain Assessment No/denies pain  Cognition  Arousal/Alertness Awake/alert  Behavior During Therapy WFL for tasks assessed/performed  Overall Cognitive Status Within Functional Limits for tasks assessed  Upper Extremity Assessment  Upper Extremity Assessment Defer to OT  evaluation  Lower Extremity Assessment  Lower Extremity Assessment RLE deficits/detail  RLE Deficits / Details ORIF of R tib. NWB  RLE Sensation decreased light touch (notes reduction in R foot sensation)  Cervical / Trunk Assessment  Cervical / Trunk Assessment Normal  Bed Mobility  Overal bed mobility Modified Independent  General bed mobility comments elevation on HOB and used bedrail  Transfers  Overall transfer level Needs assistance  Equipment used Rolling walker (2 wheeled);1 person hand held assist  Transfers Sit to/from [employer] Transfers  Sit to Stand Min guard;Min assist  Stand pivot transfers Min guard;Min assist  General transfer comment pt attempts to do TDWB, initially needed cues to manage this  Ambulation/Gait  Ambulation/Gait assistance Min guard  Gait Distance (Feet) 7 Feet  Assistive device Rolling walker (2 wheeled);1 person hand held assist  General Gait Details hopping on LLE to manage RLE NWB, but with practice can maintain on RW  Gait velocity reduced  Stairs Yes  Stairs assistance Min guard  Stair Management One rail Right;One rail Left;Forwards;With crutches (one crutch and one hand rail)  Number of Stairs 4  General stair comments pt reviewed keeping crutch wiht RLE and using LLE as power, better on down steps  Balance  Overall balance assessment Modified Independent  General Comments  General comments (skin integrity, edema, etc.) maintaining ext posture on RLE with immob, good control and supported ext posture in chair  PT - End of Session  Equipment Utilized During Treatment Gait belt  Activity Tolerance No increased pain;Patient tolerated treatment well;Patient limited by fatigue  Patient left in chair;with call bell/phone within reach;with chair alarm set  Nurse Communication Mobility status  PT Assessment  PT Recommendation/Assessment Patient needs continued PT services  PT Visit Diagnosis Unsteadiness on feet (R26.81);History of  falling (Z91.81);Pain  Pain - Right/Left Right  Pain - part of body Knee  PT Problem List Decreased strength;Decreased range of motion;Decreased activity tolerance;Decreased mobility;Decreased balance;Decreased coordination;Decreased knowledge of use of DME;Decreased safety awareness;Cardiopulmonary status limiting activity;Pain;Decreased skin integrity  Barriers to Discharge Decreased caregiver support;Inaccessible home environment  Barriers to Discharge Comments home with family but stairs to enter  PT Plan  PT Frequency (ACUTE ONLY) Min 5X/week  PT Treatment/Interventions (ACUTE ONLY) DME instruction;Gait training;Stair training;Functional mobility training;Therapeutic activities;Therapeutic exercise;Balance training;Neuromuscular re-education;Patient/family education  AM-PAC PT "6 Clicks" Mobility Outcome Measure (Version 2)  Help needed turning from your back to your side while in a flat bed without using bedrails? 3  Help needed moving from lying on your back to sitting on the side of a flat bed without using bedrails? 3  Help needed moving to and from a bed to a chair (including a wheelchair)? 3  Help needed standing up from a chair using your arms (e.g., wheelchair or bedside chair)? 3  Help needed to walk in hospital room? 3  Help needed climbing 3-5 steps with a railing?  3  6 Click Score 18  Consider Recommendation of Discharge To: Home with [facility]  PT Recommendation  Follow Up Recommendations Home health PT;Supervision for mobility/OOB  PT equipment Rolling walker with 5" wheels  Individuals Consulted  Consulted and Agree with Results and Recommendations Patient  Acute Rehab PT Goals  Patient Stated Goal To go home  PT Goal Formulation With patient  Time For Goal Achievement 01/28/21  Potential to Achieve Goals Good  PT Time Calculation  PT Start Time (ACUTE ONLY) 0845  PT Stop Time (ACUTE ONLY) 0924  PT Time Calculation (min) (ACUTE ONLY) 39 min  PT General Charges  $$ ACUTE  PT VISIT 1 Visit  PT Evaluation  $PT Eval Moderate Complexity 1 Mod  PT Treatments  $Gait Training 8-22 mins  Written Expression  Dominant Hand Right    Mee Hives, PT MS Acute Rehab Dept. Number: [place] and [employer]

## 2021-01-21 NOTE — Progress Notes (Addendum)
Occupational Therapy Evaluation Patient Details Name: Elizabeth Leonard MRN: [id number] DOB: 1961-09-28 Today's Date: 01/21/2021    History of Present Illness Patient is a 60 year old woman who states that she twisted her knee on the stairs Saturday morning she heard a pop and states she almost passed out from the pain.  She states she did not fall. OPEN REDUCTION INTERNAL FIXATION (ORIF) RIGHT TIBIAL PLATEAU FRACTURE (Right) as a surgical intervention.   Clinical Impression   Elizabeth Leonard reports living alone independently prior to admission. She has family close by who can provide support after d/c. Elizabeth Leonard performed functional mobility and transferred to [facility] with min guard to min A for safety and verbal cues. Pt given AE hand out and educated on use of reacher and sock aide. OT to continue to follow acutely for lower body ADLs. No OT follow up post d/c. 3 in 1 BSC commode to allow for safe d/c into home, recommend intermittent supervision for all ADLs and mobility initially.    Follow Up Recommendations  No OT follow up    Equipment Recommendations  3 in 1 bedside commode       Precautions / Restrictions Precautions Precautions: Fall Required Braces or Orthoses: Knee Immobilizer - Right Restrictions Weight Bearing Restrictions: Yes RLE Weight Bearing: Non weight bearing      Mobility Bed Mobility Overal bed mobility: Modified Independent             General bed mobility comments: HOB elevated, bed rails    Transfers Overall transfer level: Needs assistance Equipment used: Rolling walker (2 wheeled) Transfers: Stand Pivot Transfers;Sit to/from Stand Sit to Stand: Min assist Stand pivot transfers: Min guard       General transfer comment: Pt required verbal cues to adhere to RLE NWB status, and proper body positioning for safety    Balance Overall balance assessment: Modified Independent               ADL either performed or assessed with clinical judgement    ADL Overall ADL's : Needs assistance/impaired Eating/Feeding: Set up;Sitting   Grooming: Set up;Sitting   Upper Body Bathing: Set up;Sitting   Lower Body Bathing: Minimal assistance;Cueing for safety;Sit to/from stand   Upper Body Dressing : Set up;Sitting   Lower Body Dressing: Cueing for safety;Sit to/from stand;Total assistance (total for bledsoe brace)   Toilet Transfer: Minimal assistance;Cueing for safety;Stand-pivot;RW;BSC   Toileting- Clothing Manipulation and Hygiene: Modified independent;Sitting/lateral lean       Functional mobility during ADLs:  (at rw level, min guard for safety and min A for vc) General ADL Comments: pt completed ADLs at a min guard / min A level however will required compensatory strategies for lower body ADLs                  Pertinent Vitals/Pain Pain Assessment: No/denies pain     Hand Dominance Right   Extremity/Trunk Assessment Upper Extremity Assessment Upper Extremity Assessment: Overall WFL for tasks assessed   Lower Extremity Assessment Lower Extremity Assessment: Defer to PT evaluation;RLE deficits/detail RLE Deficits / Details: ORIF of R tib. NWB RLE Sensation:  (States her foot has a tingling sensation)   Cervical / Trunk Assessment Cervical / Trunk Assessment: Normal   Communication Communication Communication: No difficulties   Cognition Arousal/Alertness: Awake/alert Behavior During Therapy: WFL for tasks assessed/performed Overall Cognitive Status: Within Functional Limits for tasks assessed               General Comments  Pt with knee  ext brace and ace wrap on RLE, IV placement in [place] expects to be discharged to:: Private residence Living Arrangements: Alone Available Help at Discharge: Family (sister lives "2 minutes down the road") Type of Home: House Home Access: Stairs to enter Technical brewer of Steps: 3 Entrance Stairs-Rails: Right Home  Layout: One level (with sunken living room)     Bathroom Shower/Tub: Walk-in shower (wtih 4 inch ledge)   Bathroom Toilet: Standard Bathroom Accessibility: No   Home Equipment: Environmental consultant - 2 wheels;Crutches;Shower seat;Wheelchair - manual          Prior Functioning/Environment Level of Independence: Independent                 OT Problem List: Decreased strength;Decreased range of motion;Impaired balance (sitting and/or standing);Decreased activity tolerance;Decreased knowledge of use of DME or AE;Decreased knowledge of precautions;Pain      OT Treatment/Interventions: Self-care/ADL training;Therapeutic exercise;DME and/or AE instruction;Modalities;Balance training;Patient/family education    OT Goals(Current goals can be found in the care plan section) Acute Rehab OT Goals Patient Stated Goal: To go home Time For Goal Achievement: 02/04/21 Potential to Achieve Goals: Good ADL Goals Pt Will Perform Lower Body Bathing: with set-up;sitting/lateral leans Pt Will Perform Lower Body Dressing: with set-up;sit to/from stand Pt Will Perform Tub/Shower Transfer: with supervision;shower seat;rolling walker  OT Frequency: Min 2X/week       Co-evaluation PT/OT/SLP Co-Evaluation/Treatment: Yes Reason for Co-Treatment: For patient/therapist safety;To address functional/ADL transfers   OT goals addressed during session: ADL's and self-care;Proper use of Adaptive equipment and DME      AM-PAC OT "6 Clicks" Daily Activity     Outcome Measure Help from another person eating meals?: None Help from another person taking care of personal grooming?: A Little Help from another person toileting, which includes using toliet, bedpan, or urinal?: A Little Help from another person bathing (including washing, rinsing, drying)?: A Lot Help from another person to put on and taking off regular upper body clothing?: None Help from another person to put on and taking off regular lower body clothing?:  Total 6 Click Score: 17   End of Session Equipment Utilized During Treatment: Gait belt;Rolling walker;Other (comment) (BSC) Nurse Communication:  (WB status, assistance level)  Activity Tolerance: Patient tolerated treatment well Patient left: in chair;with call bell/phone within reach;with chair alarm set  OT Visit Diagnosis: Unsteadiness on feet (R26.81)                Time: 1031-5945 OT Time Calculation (min): 25 min Charges:  OT General Charges $OT Visit: 1 Visit OT Evaluation $OT Eval Low Complexity: 1 Low    Grayling Schranz A Martyn Timme 01/21/2021, 9:38 AM

## 2021-01-21 NOTE — Plan of Care (Signed)

## 2021-01-21 NOTE — Telephone Encounter (Signed)
I called and sw Santiago Glad to advise pt was a tib-plat fx ORIF yesterday and will sign orders for this pt.

## 2021-01-21 NOTE — TOC Transition Note (Signed)
Transition of Care [facility]) - CM/SW Discharge Note   Patient Details  Name: Elizabeth Leonard MRN: [id number] Date of Birth: 1961-09-27  [facility]) CM/SW Contact:  Sharin Mons, RN Phone Number: 01/21/2021, 11:58 AM   Clinical Narrative:    Patient will DC to: home Anticipated DC date: yes Family notified: sister Transport by: car         - s/p  OPEN REDUCTION INTERNAL FIXATION (ORIF) RIGHT TIBIAL PLATEAU FRACTURE, 4/13 Per MD patient ready for DC today . RN, patient, and patient's family  notified of DC. Pt without insurance. Pt agreeable to home health PT services. Per secure chat with PT pt can benefit from home PT services. Referral made with [facility] for charity home health PT services and accepted, start of care 4/14. Provider aware of start date for home health services. DME: 3IN 1/ BSC will delivered to pt bedside prior to d/c from floor stock by nurse.   Pt without Rx med concerns.  Post hospital f/u noted on AVS.  Pt states has transportation to home.  RNCM will sign off for now as intervention is no longer needed. Please consult [place] again if new needs arise.    Final next level of care: [place] Barriers to Discharge: No Barriers Identified   Patient Goals and CMS Choice     Choice offered to / list presented to : Patient  Discharge Placement                       Discharge Plan and Services                DME Arranged: 3-N-1 DME Agency: [employer] Date DME Agency Contacted: 01/21/21 Time DME Agency Contacted: [phone] Representative spoke with at DME Agency: Nurse will provide pt with BSC from floor stock prior to d/c HH Arranged: PT HH Agency: [facility] Date [place]: 01/21/21 Time [place]: 1157 Representative spoke with at [place]: Amy  Social Determinants of Health ([place]) Interventions     Readmission Risk Interventions No flowsheet data  found.

## 2021-01-22 NOTE — Anesthesia Postprocedure Evaluation (Signed)
Anesthesia Post Note  Patient: Elizabeth Leonard  Procedure(s) Performed: OPEN REDUCTION INTERNAL FIXATION (ORIF) RIGHT TIBIAL PLATEAU FRACTURE (Right )     Patient location during evaluation: PACU Anesthesia Type: General Level of consciousness: awake and alert Pain management: pain level controlled Vital Signs Assessment: post-procedure vital signs reviewed and stable Respiratory status: spontaneous breathing, nonlabored ventilation, respiratory function stable and patient connected to nasal cannula oxygen Cardiovascular status: blood pressure returned to baseline and stable Postop Assessment: no apparent nausea or vomiting Anesthetic complications: no   No complications documented.  Last Vitals:  Vitals:   01/21/21 0324 01/21/21 0745  BP: (!) 129/53 128/63  Pulse: 62 67  Resp: 16 18  Temp: 36.7 C 36.4 C  SpO2: 93% 97%    Last Pain:  Vitals:   01/21/21 0830  TempSrc:   PainSc: 0-No pain                 Belenda Cruise P Kaleel Schmieder

## 2021-01-25 ENCOUNTER — Other Ambulatory Visit: Payer: Self-pay | Admitting: Physician Assistant

## 2021-01-25 ENCOUNTER — Telehealth: Payer: Self-pay

## 2021-01-25 NOTE — Discharge Summary (Signed)
Discharge Diagnoses:  Active Problems:   Closed fracture of lateral portion of right tibial plateau   Tibial plateau fracture   Surgeries: Procedure(s): OPEN REDUCTION INTERNAL FIXATION (ORIF) RIGHT TIBIAL PLATEAU FRACTURE on 01/20/2021    Consultants:   Discharged Condition: Improved  Hospital Course: Elizabeth Leonard is an 60 y.o. female who was admitted 01/20/2021 with a chief complaint of Right Tibial Plateau Fracture with a final diagnosis of Right Tibial Plateau Fracture.  Patient was brought to the operating room on 01/20/2021 and underwent Procedure(s): OPEN REDUCTION INTERNAL FIXATION (ORIF) RIGHT TIBIAL PLATEAU FRACTURE.    Patient was given perioperative antibiotics:  Anti-infectives (From admission, onward)   Start     Dose/Rate Route Frequency Ordered Stop   01/21/21 0600  ceFAZolin (ANCEF) IVPB 2g/100 mL premix        2 g 200 mL/hr over 30 Minutes Intravenous On call to O.R. 01/20/21 1319 01/20/21 1635   01/20/21 2145  ceFAZolin (ANCEF) IVPB 2g/100 mL premix        2 g 200 mL/hr over 30 Minutes Intravenous Every 6 hours 01/20/21 2048 01/21/21 0911    .  Patient was given sequential compression devices, early ambulation, and aspirin for DVT prophylaxis.  Recent vital signs: No data found..  Recent laboratory studies: No results found.  Discharge Medications:   Allergies as of 01/21/2021      Reactions   Grass Pollen(k-o-r-t-swt Vern) Itching, Cough   Meperidine Hcl Other (See Comments)   Makes feel really sleepy, coma like Demerol   Pollen Extract-tree Extract [pollen Extract] Itching, Cough      Medication List    TAKE these medications   Aspercreme Lidocaine 4 % Liqd Generic drug: Lidocaine HCl Apply 1 application topically daily as needed (Pain). Roll-on   aspirin 325 MG EC tablet Take 1 tablet (325 mg total) by mouth daily.   cetirizine 10 MG tablet Commonly known as: ZYRTEC Take 10 mg by mouth daily.   ibuprofen 200 MG tablet Commonly known  as: ADVIL Take 200 mg by mouth every 6 (six) hours as needed for mild pain or moderate pain.   oxyCODONE 5 MG immediate release tablet Commonly known as: Oxy IR/ROXICODONE Take 1-2 tablets (5-10 mg total) by mouth every 4 (four) hours as needed for moderate pain (pain score 4-6).       Diagnostic Studies: DG Knee 1-2 Views Right  Result Date: 01/20/2021 CLINICAL DATA:  ORIF right tibial plateau fracture. EXAM: RIGHT KNEE - 1-2 VIEW; DG C-ARM 1-60 MIN COMPARISON:  Radiograph 01/12/2021 FINDINGS: Single frontal fluoroscopic spot view of the right knee obtained in the operating room. Lateral plate and multi screw fixation of lateral tibial plateau fracture. Total fluoroscopy time 14 seconds. Total dose 0.57 mGy. IMPRESSION: Intraoperative fluoroscopy during right knee fracture ORIF. Electronically Signed   By: Keith Rake M.D.   On: 01/20/2021 17:39   CT KNEE RIGHT WO CONTRAST  Result Date: 01/18/2021 CLINICAL DATA:  Knee pain since falling 9 days ago. Question tibial plateau fracture. EXAM: CT OF THE RIGHT KNEE WITHOUT CONTRAST TECHNIQUE: Multidetector CT imaging of the right knee was performed according to the standard protocol. Multiplanar CT image reconstructions were also generated. COMPARISON:  Radiographs 01/12/2021 FINDINGS: Bones/Joint/Cartilage There is an acute comminuted fracture of the lateral tibial plateau with significant depression of the articular surface posteriorly. There is a die-punch component which appears depressed by approximately 10 mm. The lateral tibial spine appears partially undermined. There are nondisplaced fractures which extend into the  lateral tibial metaphysis. There is no involvement of the medial tibial plateau. The distal femur, patella and proximal fibula appear intact. There are underlying advanced tricompartmental degenerative changes with osteophytes and joint space narrowing. There is a small lipohemarthrosis. Ligaments Suboptimally evaluated by CT. The  cruciate ligaments appear grossly intact. Muscles and Tendons Unremarkable. Soft tissues No evidence of foreign body or soft tissue emphysema. IMPRESSION: 1. Comminuted and moderately depressed intra-articular fracture of the lateral tibial plateau as described. 2. Underlying advanced tricompartmental degenerative changes. 3. Small lipohemarthrosis. Electronically Signed   By: Richardean Sale M.D.   On: 01/18/2021 16:15   DG C-Arm 1-60 Min  Result Date: 01/20/2021 CLINICAL DATA:  ORIF right tibial plateau fracture. EXAM: RIGHT KNEE - 1-2 VIEW; DG C-ARM 1-60 MIN COMPARISON:  Radiograph 01/12/2021 FINDINGS: Single frontal fluoroscopic spot view of the right knee obtained in the operating room. Lateral plate and multi screw fixation of lateral tibial plateau fracture. Total fluoroscopy time 14 seconds. Total dose 0.57 mGy. IMPRESSION: Intraoperative fluoroscopy during right knee fracture ORIF. Electronically Signed   By: Keith Rake M.D.   On: 01/20/2021 17:39   XR Knee 1-2 Views Right  Result Date: 01/12/2021 2 view radiographs of the right knee shows tricompartmental arthritis with large bony spurs.  There is a suggestion of a possible nondisplaced lateral tibial plateau fracture.   Patient benefited maximally from their hospital stay and there were no complications.     Disposition: Discharge disposition: 01-Home or Self Care      Discharge Instructions    Call MD / Call 911   Complete by: As directed    If you experience chest pain or shortness of breath, CALL 911 and be transported to the hospital emergency room.  If you develope a fever above 101 F, pus (white drainage) or increased drainage or redness at the wound, or calf pain, call your surgeon's office.   Constipation Prevention   Complete by: As directed    Drink plenty of fluids.  Prune juice may be helpful.  You may use a stool softener, such as Colace (over the counter) 100 mg twice a day.  Use MiraLax (over the counter) for  constipation as needed.   Diet - low sodium heart healthy   Complete by: As directed    Increase activity slowly as tolerated   Complete by: As directed       Follow-up Information    [facility], Bevely Palmer, PA In 1 week.   Specialty: Orthopedic Surgery Contact information: [address] [place] [address] [phone]                Signed: Bevely Palmer Gabrial Poppell 01/25/2021, 8:19 AM

## 2021-01-25 NOTE — Telephone Encounter (Signed)
Pete with [employer] wanted me to pass the message along the pt would start [facility] PT for 1 time a week for 3 weeks. If any questions his CB # [phone]

## 2021-01-25 NOTE — Telephone Encounter (Signed)
Noted  

## 2021-01-27 ENCOUNTER — Encounter: Payer: Self-pay | Admitting: Physician Assistant

## 2021-01-27 ENCOUNTER — Ambulatory Visit (INDEPENDENT_AMBULATORY_CARE_PROVIDER_SITE_OTHER): Payer: Self-pay | Admitting: Physician Assistant

## 2021-01-27 DIAGNOSIS — S82141A Displaced bicondylar fracture of right tibia, initial encounter for closed fracture: Secondary | ICD-10-CM
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
## 2021-02-04 ENCOUNTER — Encounter: Payer: Self-pay | Admitting: Orthopedic Surgery

## 2021-02-04 ENCOUNTER — Ambulatory Visit (INDEPENDENT_AMBULATORY_CARE_PROVIDER_SITE_OTHER): Payer: Self-pay | Admitting: Orthopedic Surgery

## 2021-02-04 ENCOUNTER — Ambulatory Visit (INDEPENDENT_AMBULATORY_CARE_PROVIDER_SITE_OTHER): Payer: Self-pay

## 2021-02-04 DIAGNOSIS — S82141A Displaced bicondylar fracture of right tibia, initial encounter for closed fracture: Secondary | ICD-10-CM
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
## 2021-02-22 ENCOUNTER — Ambulatory Visit (INDEPENDENT_AMBULATORY_CARE_PROVIDER_SITE_OTHER): Payer: Self-pay | Admitting: Physician Assistant

## 2021-02-22 ENCOUNTER — Ambulatory Visit (INDEPENDENT_AMBULATORY_CARE_PROVIDER_SITE_OTHER): Payer: Self-pay

## 2021-02-22 ENCOUNTER — Encounter: Payer: Self-pay | Admitting: Physician Assistant

## 2021-02-22 DIAGNOSIS — Z8781 Personal history of (healed) traumatic fracture: Secondary | ICD-10-CM

## 2021-02-22 DIAGNOSIS — Z9889 Other specified postprocedural states: Secondary | ICD-10-CM
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
## 2021-03-22 ENCOUNTER — Ambulatory Visit (INDEPENDENT_AMBULATORY_CARE_PROVIDER_SITE_OTHER): Payer: Self-pay | Admitting: Physician Assistant

## 2021-03-22 ENCOUNTER — Encounter: Payer: Self-pay | Admitting: Orthopedic Surgery

## 2021-03-22 DIAGNOSIS — Z8781 Personal history of (healed) traumatic fracture: Secondary | ICD-10-CM

## 2021-03-22 DIAGNOSIS — Z9889 Other specified postprocedural states: Secondary | ICD-10-CM
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
## 2021-03-30 ENCOUNTER — Telehealth: Payer: Self-pay | Admitting: Orthopedic Surgery

## 2021-03-30 NOTE — Telephone Encounter (Signed)
Faxed Op note & last ov note to [facility] [phone]

## 2021-04-21 ENCOUNTER — Ambulatory Visit (INDEPENDENT_AMBULATORY_CARE_PROVIDER_SITE_OTHER): Payer: Self-pay | Admitting: Family

## 2021-04-21 ENCOUNTER — Other Ambulatory Visit: Payer: Self-pay

## 2021-04-21 ENCOUNTER — Encounter: Payer: Self-pay | Admitting: Family

## 2021-04-21 DIAGNOSIS — S82121D Displaced fracture of lateral condyle of right tibia, subsequent encounter for closed fracture with routine healing: Secondary | ICD-10-CM
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
## 2021-12-21 IMAGING — CT CT KNEE*R* W/O CM
1 series · 12 of 14 positions shown, 15 images · non-contrast
Comparison: Radiographs 01/12/2021

CLINICAL DATA: Knee pain since falling 9 days ago. Question tibial
plateau fracture.

EXAM:
CT OF THE RIGHT KNEE WITHOUT CONTRAST
TECHNIQUE: Multidetector CT imaging of the right knee was performed according
to the standard protocol. Multiplanar CT image reconstructions were
also generated.

[Series 5: axial soft · axial · 0.49mm/px · z∈[+498,+746]mm · 12 of 148 slices shown, 15 images]
[im 12/148  soft-tissue]
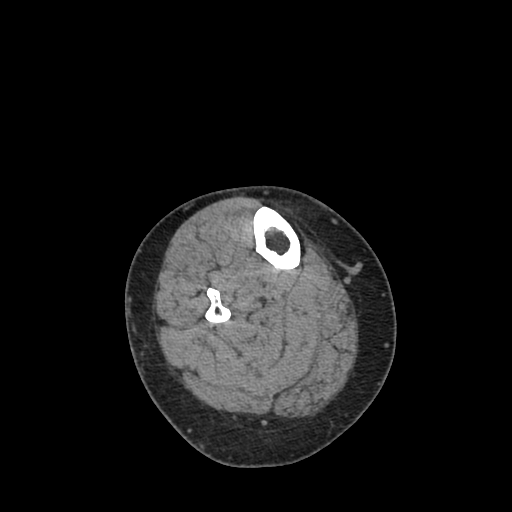
[im 12/148  bone]
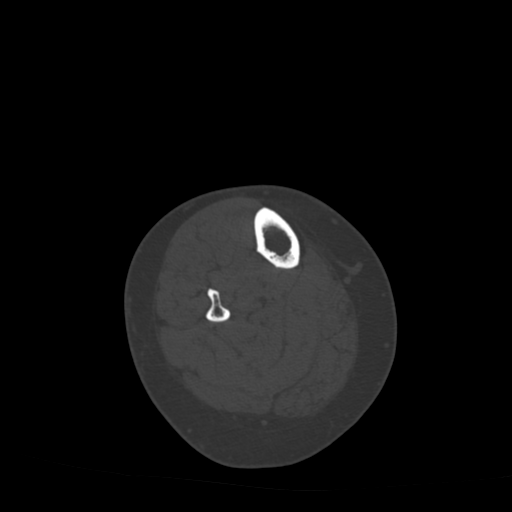
[im 23/148  bone]
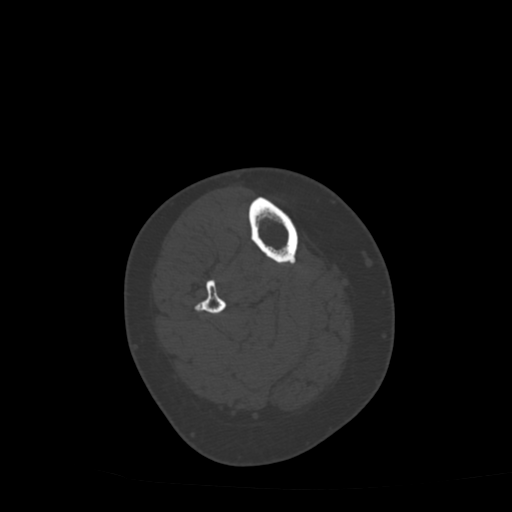
[im 34/148  bone]
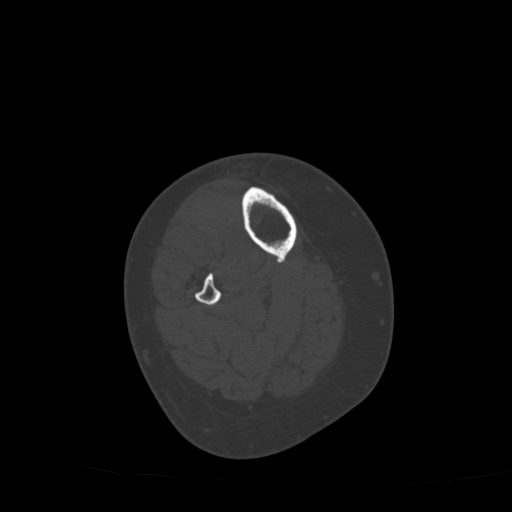
[im 46/148  bone]
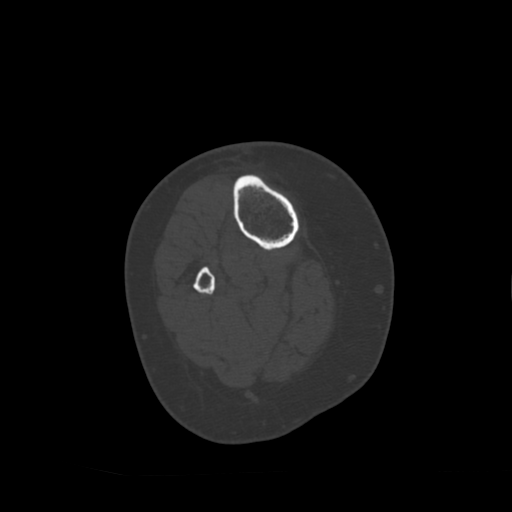
[im 57/148  soft-tissue]
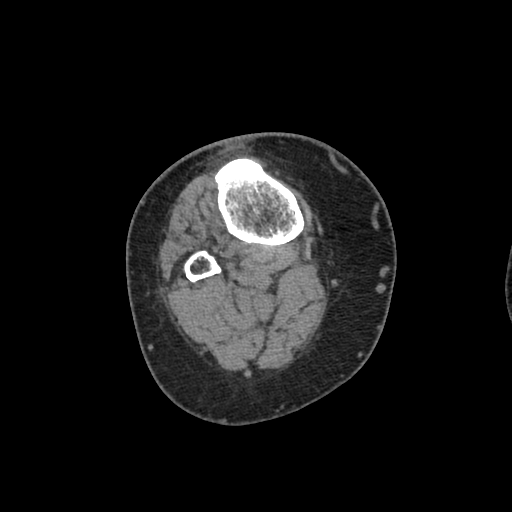
[im 57/148  bone]
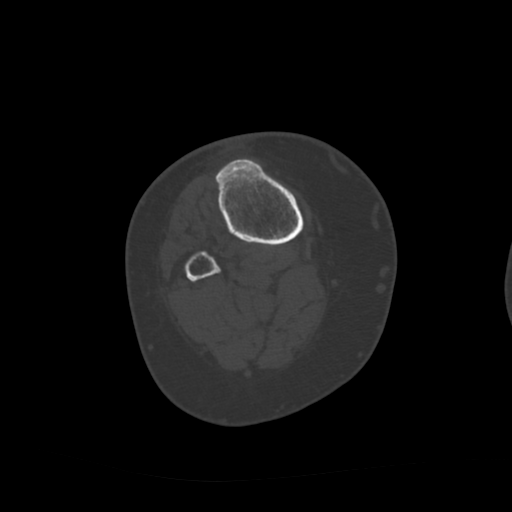
[im 68/148  bone]
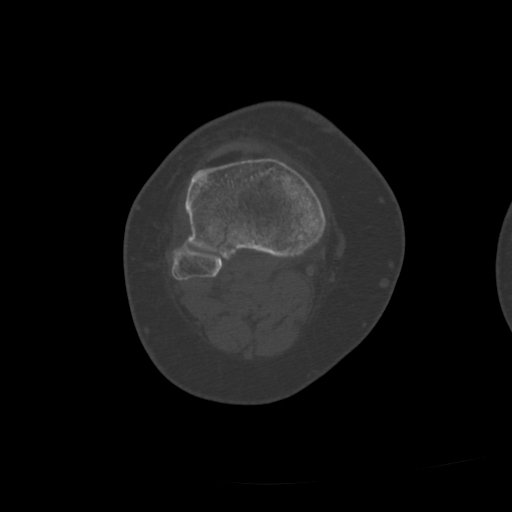
[im 80/148  bone]
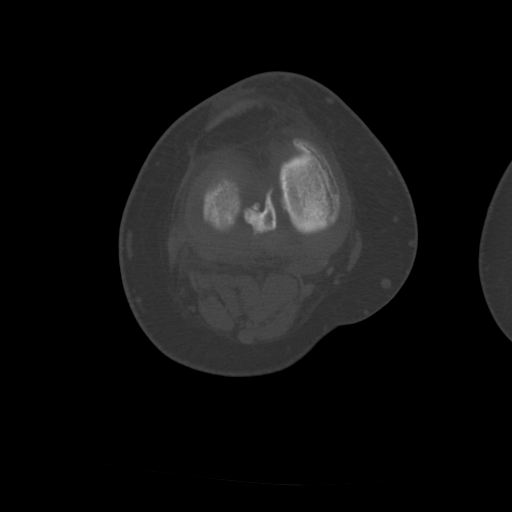
[im 91/148  bone]
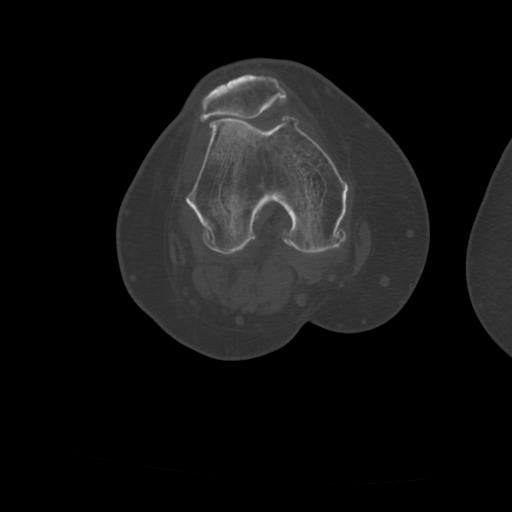
[im 102/148  soft-tissue]
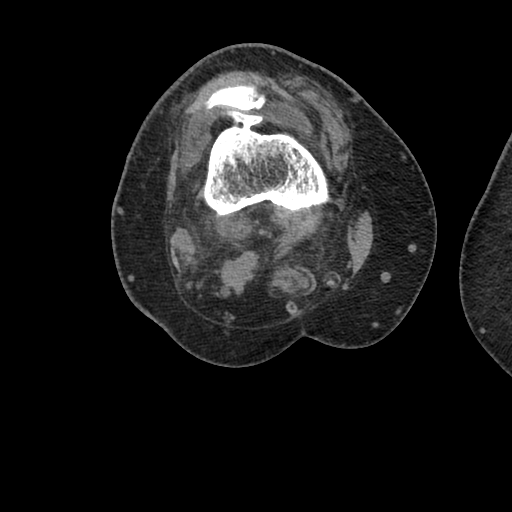
[im 102/148  bone]
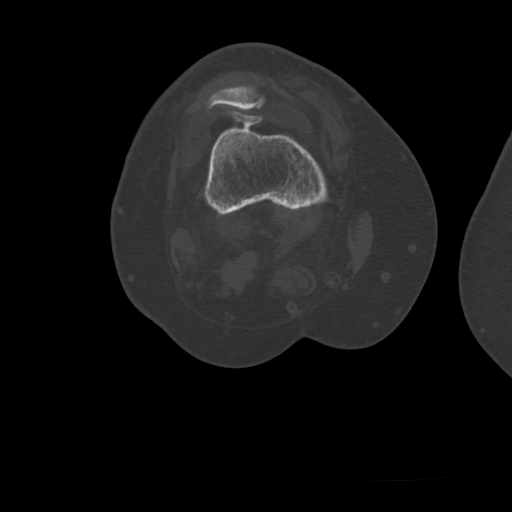
[im 114/148  bone]
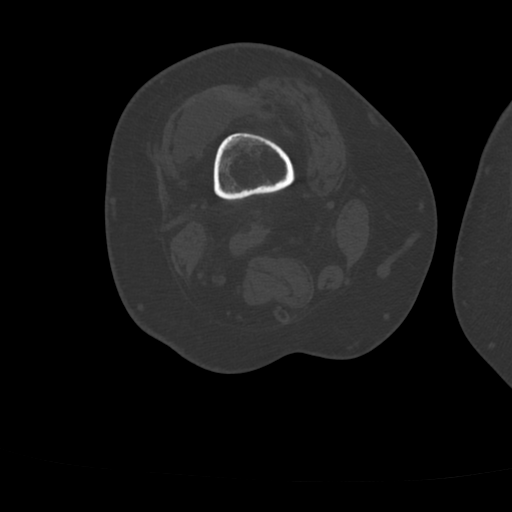
[im 125/148  bone]
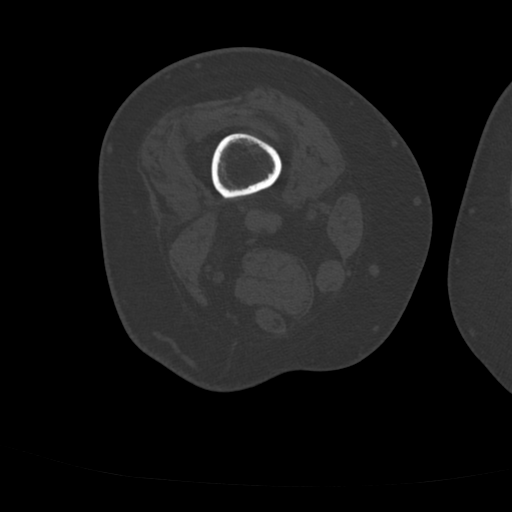
[im 136/148  bone]
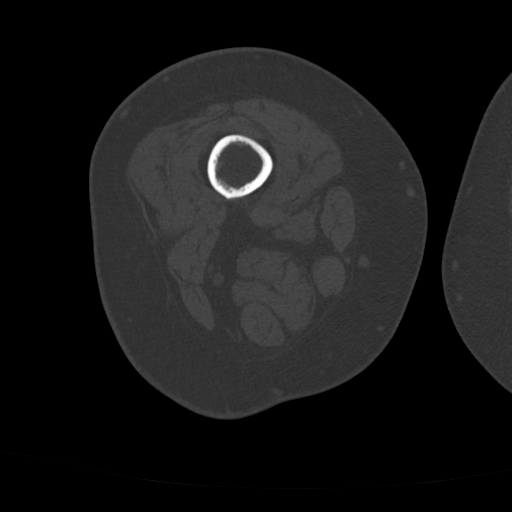

[12 of 14 positions shown; findings below may reference images not displayed]

FINDINGS: Bones/Joint/Cartilage

There is an acute comminuted fracture of the lateral tibial plateau
with significant depression of the articular surface posteriorly.
There is a die-punch component which appears depressed by
approximately 10 mm. The lateral tibial spine appears partially
undermined. There are nondisplaced fractures which extend into the
lateral tibial metaphysis. There is no involvement of the medial
tibial plateau.

The distal femur, patella and proximal fibula appear intact. There
are underlying advanced tricompartmental degenerative changes with
osteophytes and joint space narrowing. There is a small
lipohemarthrosis.

Ligaments

Suboptimally evaluated by CT. The cruciate ligaments appear grossly
intact.

Muscles and Tendons

Unremarkable.

Soft tissues

No evidence of foreign body or soft tissue emphysema.
IMPRESSION: 1. Comminuted and moderately depressed intra-articular fracture of
the lateral tibial plateau as described.
2. Underlying advanced tricompartmental degenerative changes.
3. Small lipohemarthrosis.

## 2021-12-23 IMAGING — RF DG KNEE 1-2V*R*
1 series · 1 of 1 positions shown · non-contrast
Comparison: Radiograph 01/12/2021

CLINICAL DATA: ORIF right tibial plateau fracture.

EXAM:
RIGHT KNEE - 1-2 VIEW; DG C-ARM 1-60 MIN

[Series 1: run · 1 of 1 slices shown]
[im 1/1]
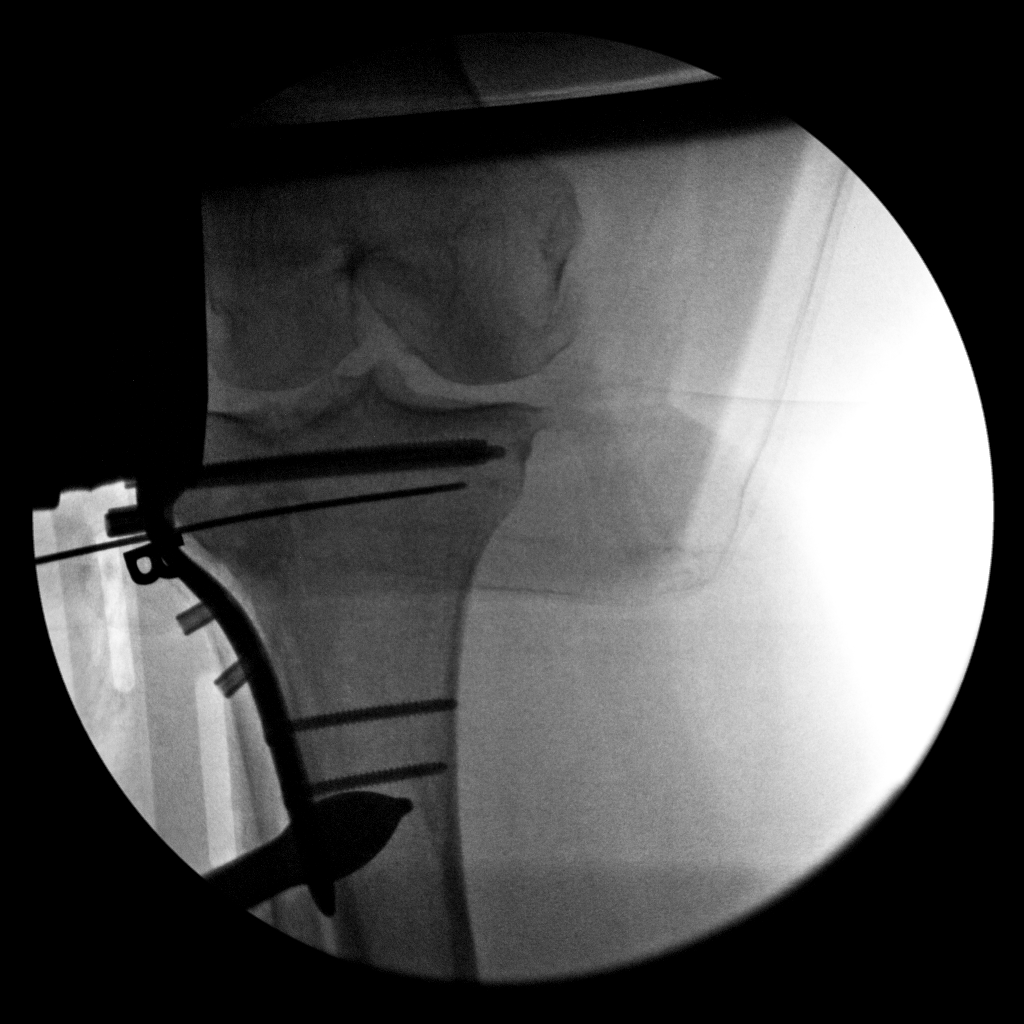

[1 of 1 positions shown; findings below may reference images not displayed]

FINDINGS: Single frontal fluoroscopic spot view of the right knee obtained in
the operating room. Lateral plate and multi screw fixation of
lateral tibial plateau fracture. Total fluoroscopy time 14 seconds.
Total dose 0.57 mGy.
IMPRESSION: Intraoperative fluoroscopy during right knee fracture ORIF.
